# Patient Record
Sex: Female | Born: 1990 | Race: White | Hispanic: No | Marital: Married | Smoking: Never smoker
Health system: Southern US, Community
[De-identification: ages and names within clinical notes are randomized; demographics above are authoritative.]

## PROBLEM LIST (undated history)

## (undated) DIAGNOSIS — I10 Essential (primary) hypertension: Secondary | ICD-10-CM

## (undated) HISTORY — PX: DIAGNOSTIC LAPAROSCOPY: SUR761

## (undated) HISTORY — PX: NO PAST SURGERIES: SHX2092

---

## 2017-09-15 ENCOUNTER — Encounter: Payer: Self-pay | Admitting: Certified Nurse Midwife

## 2017-09-15 ENCOUNTER — Ambulatory Visit: Payer: BLUE CROSS/BLUE SHIELD | Admitting: Certified Nurse Midwife

## 2017-09-15 VITALS — BP 138/104 | HR 78 | Ht 65.0 in | Wt 319.4 lb

## 2017-09-15 DIAGNOSIS — N939 Abnormal uterine and vaginal bleeding, unspecified: Secondary | ICD-10-CM | POA: Diagnosis not present

## 2017-09-15 LAB — POCT URINE PREGNANCY: Preg Test, Ur: NEGATIVE

## 2017-09-15 NOTE — Progress Notes (Signed)
GYN ENCOUNTER NOTE  Subjective:       Natasha Carter is a 27 y.o. G0P0000 female is here for gynecologic evaluation of the following issues:  1. Abnormal uterine bleeding. She states that she has been spotting ( brown) every day since September. She has a history of irregular periods that she had been treated with birth control in the past . She that it did not work to regulate her bleeding. It made her bleeding heavier. She admits to 100 lb weight gain over the last 3 years. She is dieting and exercising now and has lost some weight. She also state she is concerned about a "prolapse"  . Stating that when she moved in July when picking up a table she felt a "drop" the started to vaginal bleeding .    Gynecologic History Patient's last menstrual period was 08/14/2017 (approximate). Contraception: none   Obstetric History OB History  Gravida Para Term Preterm AB Living  0 0 0 0 0 0  SAB TAB Ectopic Multiple Live Births  0 0 0 0 0        History reviewed. No pertinent past medical history.  History reviewed. No pertinent surgical history.  No current outpatient medications on file prior to visit.   No current facility-administered medications on file prior to visit.     No Known Allergies  Social History   Socioeconomic History  . Marital status: Married    Spouse name: Not on file  . Number of children: Not on file  . Years of education: Not on file  . Highest education level: Not on file  Social Needs  . Financial resource strain: Not on file  . Food insecurity - worry: Not on file  . Food insecurity - inability: Not on file  . Transportation needs - medical: Not on file  . Transportation needs - non-medical: Not on file  Occupational History  . Not on file  Tobacco Use  . Smoking status: Never Smoker  . Smokeless tobacco: Never Used  Substance and Sexual Activity  . Alcohol use: No    Frequency: Never  . Drug use: No  . Sexual activity: Not Currently    Birth  control/protection: None  Other Topics Concern  . Not on file  Social History Narrative  . Not on file    History reviewed. No pertinent family history.  The following portions of the patient's history were reviewed and updated as appropriate: allergies, current medications, past family history, past medical history, past social history, past surgical history and problem list.  Review of Systems Review of Systems - Negative except as mentioned in HPI  Review of Systems - General ROS: negative for - chills, fatigue, fever, hot flashes, malaise or night sweats Hematological and Lymphatic ROS: negative for - bleeding problems or swollen lymph nodes Gastrointestinal ROS: negative for - abdominal pain, blood in stools, change in bowel habits and nausea/vomiting Musculoskeletal ROS: negative for - joint pain, muscle pain or muscular weakness Genito-Urinary ROS: negative for - change in menstrual cycle, dysmenorrhea, dyspareunia, dysuria, genital discharge, genital ulcers, hematuria, incontinence, irregular/heavy menses, nocturia or pelvic pain. Positive for irregular bleeding and spotting and pressure  Objective:   BP (!) 138/104   Pulse 78   Ht 5\' 5"  (1.651 m)   Wt (!) 319 lb 6 oz (144.9 kg)   LMP 08/14/2017 (Approximate)   BMI 53.15 kg/m  CONSTITUTIONAL: Well-developed, well-nourished morbidly obese female in no acute distress.  HENT:  Normocephalic, atraumatic.  NECK:  Normal range of motion, supple,  SKIN: Skin is warm and dry. No rash noted. Not diaphoretic. No erythema. No pallor. NEUROLGIC: Alert and oriented to person, place, and time.  PSYCHIATRIC: Normal mood and affect. Normal behavior. Normal judgment and thought content. CARDIOVASCULAR:Not Examined RESPIRATORY: Not Examined BREASTS: Not Examined ABDOMEN: Soft, non distended; Non tender.  No Organomegaly. Exam compromised due to body habitus PELVIC:  External Genitalia: Normal  BUS: Normal  Vagina: Normal, no signs of  prolapse, small amount brown blood seen.   Cervix: Normal  Uterus: Normal size, shape,consistency, mobile, exam compromised due to body habitus  Adnexa: Normal  RV: Normal   Bladder: Nontender MUSCULOSKELETAL: Normal range of motion. No tenderness.  No cyanosis, clubbing, or edema.     Assessment:   1. Abnormal uterine bleeding - CBC - TSH - Prolactin - FSH/LH - Testosterone - US PELVIS (TRANSABDOMINAL ONLY); Future - POCT urine pregnancy     Plan:   Encouraged continued weigh loss with diet and exercise. Discussed weight in relationship to menstrual cycle. Labs today. Discussed elevated BP today. She was encouraged to follow up with primary care. She declines referral stating she will see the provider her partner sees. Ultrasound ordered. Will follow up with results.   Doreene Burke, CN M

## 2017-09-15 NOTE — Progress Notes (Signed)
Pt is here with c/o irregular periods. She is concerned she might have a prolapse.

## 2017-09-15 NOTE — Patient Instructions (Signed)

## 2017-09-16 LAB — CBC
HEMATOCRIT: 40.5 % (ref 34.0–46.6)
HEMOGLOBIN: 14 g/dL (ref 11.1–15.9)
MCH: 28.6 pg (ref 26.6–33.0)
MCHC: 34.6 g/dL (ref 31.5–35.7)
MCV: 83 fL (ref 79–97)
Platelets: 244 10*3/uL (ref 150–379)
RBC: 4.9 x10E6/uL (ref 3.77–5.28)
RDW: 13.2 % (ref 12.3–15.4)
WBC: 6.4 10*3/uL (ref 3.4–10.8)

## 2017-09-16 LAB — TESTOSTERONE: TESTOSTERONE: 43 ng/dL (ref 8–48)

## 2017-09-16 LAB — FSH/LH
FSH: 4.9 m[IU]/mL
LH: 13.7 m[IU]/mL

## 2017-09-16 LAB — PROLACTIN: PROLACTIN: 14 ng/mL (ref 4.8–23.3)

## 2017-09-16 LAB — TSH: TSH: 1.27 u[IU]/mL (ref 0.450–4.500)

## 2017-09-21 ENCOUNTER — Telehealth: Payer: Self-pay

## 2017-09-21 NOTE — Telephone Encounter (Signed)
Pt informed of normal test results per provider

## 2017-11-10 ENCOUNTER — Telehealth: Payer: Self-pay | Admitting: *Deleted

## 2017-11-10 NOTE — Telephone Encounter (Signed)
Patient called and states that she is experiencing heavy bleeding .Patient states she has been bleeding since her last appt on and off pretty light. Patient states today the bleeding was really heavy and she passed 3 blood clots. Patient states she has been very tired and dizzy since then. The bleeding has stopped. Patient is requesting a call back. Her call back # is [phone]. Please advise. Thank you

## 2017-11-11 ENCOUNTER — Telehealth: Payer: Self-pay

## 2017-11-11 NOTE — Telephone Encounter (Signed)
Message left for pt to return call

## 2017-12-16 NOTE — H&P (Signed)
Ms. Leuthold is a 27 y.o. female here for Discuss sono . Pt with continued heavy menorrhagia  Not controlled with provera and OCPs . soaking bed sheets    Past Medical History:  has a past medical history of Hypertension.  Past Surgical History:  has no past surgical history on file. Family History: family history includes Lung cancer in her paternal grandmother. Social History:  reports that she has never smoked. She has never used smokeless tobacco. She reports that she drank alcohol. OB/GYN History:          OB History    Gravida  0   Para  0   Term  0   Preterm  0   AB  0   Living  0     SAB  0   TAB  0   Ectopic  0   Molar  0   Multiple  0   Live Births  0          Allergies: has No Known Allergies. Medications:  Current Outpatient Medications:  .  hydroCHLOROthiazide (HYDRODIURIL) 25 MG tablet, , Disp: , Rfl: 0 .  norethindrone-ethinyl estradiol (ORTHO-NOVUM, NORTREL) 1-35 mg-mcg tablet, Take 1 tablet by mouth once daily, Disp: 3 Package, Rfl: 4 .  ondansetron (ZOFRAN) 8 MG tablet, Take 1 tablet (8 mg total) by mouth every 4 (four) hours, Disp: 20 tablet, Rfl: 0 .  phentermine (ADIPEX-P) 37.5 MG capsule, Take 1 capsule (37.5 mg total) by mouth every morning before breakfast, Disp: 30 capsule, Rfl: 0  Review of Systems: General:                      No fatigue or weight loss Eyes:                           No vision changes Ears:                            No hearing difficulty Respiratory:                No cough or shortness of breath Pulmonary:                  No asthma or shortness of breath Cardiovascular:           No chest pain, palpitations, dyspnea on exertion Gastrointestinal:          No abdominal bloating, chronic diarrhea, constipations, masses, pain or hematochezia Genitourinary:             No hematuria, dysuria, abnormal vaginal discharge, pelvic pain, Menometrorrhagia Lymphatic:                   No swollen lymph  nodes Musculoskeletal:         No muscle weakness Neurologic:                  No extremity weakness, syncope, seizure disorder Psychiatric:                  No history of depression, delusions or suicidal/homicidal ideation    Exam:      Vitals:   12/16/17 1050  BP: (!) 143/108  Pulse: 89    Body mass index is 52.25 kg/m.  WDWN white/  female in NAD   Lungs: CTA  CV : RRR without murmur   Neck:  no  thyromegaly Abdomen: soft , no mass, normal active bowel sounds,  non-tender, no rebound tenderness Pelvic: tanner stage 5 ,  External genitalia: vulva /labia no lesions Urethra: no prolapse Vagina: normal physiologic d/c 2 cc with clots Cervix: no lesions, no cervical motion tenderness   Uterus: normal size shape and contour, non-tender Adnexa: no mass,  non-tender   Rectovaginal: U/s:  Transvag (limited views due to body habitus) & transabd scans performed   Ut retroverted  Endometrium=14.63mm  No free fluid seen  Rt ovary simple cyst=1.4cm  Lt ovary simple cyst=1.2cm        Impression:   The primary encounter diagnosis was Abnormal uterine bleeding (AUB), unspecified. A diagnosis of Menorrhagia with irregular cycle was also pertinent to this visit.  Failed conservative tx. Needs surgical intervention   Plan:  Dilation and curettage tomorrow  Benefits and risks to surgery: The proposed benefit of the surgery has been discussed with the patient. The possible risks include, but are not limited to: organ injury to the bowel , bladder, ureters, and major blood vessels and nerves. There is a possibility of additional surgeries resulting from these injuries. There is also the risk of blood transfusion and the need to receive blood products during or after the procedure which may rarely lead to HIV or Hepatitis C infection. There is a risk of developing a deep venous thrombosis or a pulmonary embolism . There is the possibility of wound infection  and also anesthetic complications, even the rare possibility of death. The patient understands these risks and wishes to proceed. All questions have been answered and the consent has been signed.    No follow-ups on file.  Vilma Prader, MD

## 2017-12-17 ENCOUNTER — Other Ambulatory Visit: Payer: Self-pay

## 2017-12-17 ENCOUNTER — Ambulatory Visit
Admission: RE | Admit: 2017-12-17 | Discharge: 2017-12-17 | Disposition: A | Discharge status: Home / Self Care | Payer: BLUE CROSS/BLUE SHIELD | Source: Ambulatory Visit | Attending: Obstetrics and Gynecology | Admitting: Obstetrics and Gynecology

## 2017-12-17 ENCOUNTER — Ambulatory Visit: Payer: BLUE CROSS/BLUE SHIELD

## 2017-12-17 ENCOUNTER — Encounter
Admission: RE | Disposition: A | Discharge status: Home / Self Care | Payer: Self-pay | Source: Ambulatory Visit | Attending: Obstetrics and Gynecology

## 2017-12-17 ENCOUNTER — Encounter: Payer: Self-pay | Admitting: *Deleted

## 2017-12-17 DIAGNOSIS — N920 Excessive and frequent menstruation with regular cycle: Secondary | ICD-10-CM | POA: Insufficient documentation

## 2017-12-17 DIAGNOSIS — I10 Essential (primary) hypertension: Secondary | ICD-10-CM | POA: Diagnosis not present

## 2017-12-17 DIAGNOSIS — N915 Oligomenorrhea, unspecified: Secondary | ICD-10-CM | POA: Insufficient documentation

## 2017-12-17 DIAGNOSIS — Z79899 Other long term (current) drug therapy: Secondary | ICD-10-CM | POA: Insufficient documentation

## 2017-12-17 DIAGNOSIS — Z793 Long term (current) use of hormonal contraceptives: Secondary | ICD-10-CM | POA: Insufficient documentation

## 2017-12-17 HISTORY — DX: Essential (primary) hypertension: I10

## 2017-12-17 HISTORY — PX: DILATION AND CURETTAGE OF UTERUS: SHX78

## 2017-12-17 LAB — BASIC METABOLIC PANEL
Anion gap: 6 (ref 5–15)
BUN: 13 mg/dL (ref 6–20)
CO2: 24 mmol/L (ref 22–32)
Calcium: 8.9 mg/dL (ref 8.9–10.3)
Chloride: 105 mmol/L (ref 101–111)
Creatinine, Ser: 0.92 mg/dL (ref 0.44–1.00)
GFR calc Af Amer: >60 mL/min (ref >60)
GLUCOSE: 108 mg/dL — AB (ref 65–99)
POTASSIUM: 4 mmol/L (ref 3.5–5.1)
Sodium: 135 mmol/L (ref 135–145)

## 2017-12-17 LAB — CBC
HCT: 40 % (ref 35.0–47.0)
Hemoglobin: 13.9 g/dL (ref 12.0–16.0)
MCH: 29.4 pg (ref 26.0–34.0)
MCHC: 34.8 g/dL (ref 32.0–36.0)
MCV: 84.6 fL (ref 80.0–100.0)
PLATELETS: 249 10*3/uL (ref 150–440)
RBC: 4.73 MIL/uL (ref 3.80–5.20)
RDW: 13.4 % (ref 11.5–14.5)
WBC: 6.6 10*3/uL (ref 3.6–11.0)

## 2017-12-17 LAB — POCT PREGNANCY, URINE: Preg Test, Ur: NEGATIVE

## 2017-12-17 SURGERY — DILATION AND CURETTAGE
Anesthesia: General | Site: Uterus | Wound class: Clean Contaminated

## 2017-12-17 MED ORDER — ONDANSETRON HCL 4 MG/2ML IJ SOLN
INTRAMUSCULAR | Status: AC
  Filled 2017-12-17: qty 2

## 2017-12-17 MED ORDER — ACETAMINOPHEN 10 MG/ML IV SOLN
INTRAVENOUS | Status: AC
  Filled 2017-12-17: qty 100

## 2017-12-17 MED ORDER — FENTANYL CITRATE (PF) 100 MCG/2ML IJ SOLN
INTRAMUSCULAR | Status: AC
  Administered 2017-12-17: 25 ug via INTRAVENOUS
  Filled 2017-12-17: qty 2

## 2017-12-17 MED ORDER — ACETAMINOPHEN 10 MG/ML IV SOLN
INTRAVENOUS | Status: DC | PRN
  Administered 2017-12-17: 1000 mg via INTRAVENOUS

## 2017-12-17 MED ORDER — EPHEDRINE SULFATE 50 MG/ML IJ SOLN
INTRAMUSCULAR | Status: DC | PRN
  Administered 2017-12-17 (×2): 10 mg via INTRAVENOUS

## 2017-12-17 MED ORDER — FAMOTIDINE 20 MG PO TABS
20.0000 mg | ORAL_TABLET | Freq: Once | ORAL | Status: AC
  Administered 2017-12-17: 20 mg via ORAL

## 2017-12-17 MED ORDER — OXYCODONE HCL 5 MG PO TABS
ORAL_TABLET | ORAL | Status: AC
  Filled 2017-12-17: qty 1

## 2017-12-17 MED ORDER — LACTATED RINGERS IV SOLN
INTRAVENOUS | Status: DC
  Administered 2017-12-17: 08:00:00 via INTRAVENOUS

## 2017-12-17 MED ORDER — KETOROLAC TROMETHAMINE 30 MG/ML IJ SOLN
INTRAMUSCULAR | Status: AC
  Filled 2017-12-17: qty 1

## 2017-12-17 MED ORDER — PROPOFOL 10 MG/ML IV BOLUS
INTRAVENOUS | Status: AC
  Filled 2017-12-17: qty 20

## 2017-12-17 MED ORDER — LIDOCAINE HCL (PF) 2 % IJ SOLN
INTRAMUSCULAR | Status: AC
  Filled 2017-12-17: qty 10

## 2017-12-17 MED ORDER — LIDOCAINE HCL (CARDIAC) PF 100 MG/5ML IV SOSY
PREFILLED_SYRINGE | INTRAVENOUS | Status: DC | PRN
  Administered 2017-12-17: 80 mg via INTRAVENOUS

## 2017-12-17 MED ORDER — DEXAMETHASONE SODIUM PHOSPHATE 10 MG/ML IJ SOLN
INTRAMUSCULAR | Status: DC | PRN
  Administered 2017-12-17: 5 mg via INTRAVENOUS

## 2017-12-17 MED ORDER — MIDAZOLAM HCL 2 MG/2ML IJ SOLN
INTRAMUSCULAR | Status: DC | PRN
  Administered 2017-12-17: 1 mg via INTRAVENOUS

## 2017-12-17 MED ORDER — FENTANYL CITRATE (PF) 100 MCG/2ML IJ SOLN
25.0000 ug | INTRAMUSCULAR | Status: DC | PRN
  Administered 2017-12-17 (×4): 25 ug via INTRAVENOUS

## 2017-12-17 MED ORDER — DEXAMETHASONE SODIUM PHOSPHATE 10 MG/ML IJ SOLN
INTRAMUSCULAR | Status: AC
  Filled 2017-12-17: qty 1

## 2017-12-17 MED ORDER — METHYLERGONOVINE MALEATE 0.2 MG/ML IJ SOLN
INTRAMUSCULAR | Status: AC
  Filled 2017-12-17: qty 1

## 2017-12-17 MED ORDER — PROMETHAZINE HCL 25 MG/ML IJ SOLN: 6.2500 mg | INTRAMUSCULAR | Status: DC | PRN

## 2017-12-17 MED ORDER — PROPOFOL 10 MG/ML IV BOLUS
INTRAVENOUS | Status: DC | PRN
  Administered 2017-12-17: 250 mg via INTRAVENOUS
  Administered 2017-12-17: 40 mg via INTRAVENOUS

## 2017-12-17 MED ORDER — FAMOTIDINE 20 MG PO TABS
ORAL_TABLET | ORAL | Status: AC
  Administered 2017-12-17: 20 mg via ORAL
  Filled 2017-12-17: qty 1

## 2017-12-17 MED ORDER — MEPERIDINE HCL 50 MG/ML IJ SOLN: 6.2500 mg | INTRAMUSCULAR | Status: DC | PRN

## 2017-12-17 MED ORDER — OXYCODONE HCL 5 MG/5ML PO SOLN: 5.0000 mg | Freq: Once | ORAL | Status: AC | PRN

## 2017-12-17 MED ORDER — CEFAZOLIN SODIUM-DEXTROSE 2-4 GM/100ML-% IV SOLN
INTRAVENOUS | Status: AC
  Filled 2017-12-17: qty 100

## 2017-12-17 MED ORDER — SILVER NITRATE-POT NITRATE 75-25 % EX MISC
CUTANEOUS | Status: AC
  Filled 2017-12-17: qty 4

## 2017-12-17 MED ORDER — KETOROLAC TROMETHAMINE 30 MG/ML IJ SOLN
INTRAMUSCULAR | Status: DC | PRN
  Administered 2017-12-17: 30 mg via INTRAVENOUS

## 2017-12-17 MED ORDER — CEFAZOLIN SODIUM-DEXTROSE 2-4 GM/100ML-% IV SOLN
2.0000 g | Freq: Once | INTRAVENOUS | Status: AC
Start: 2017-12-17 — End: 2017-12-17
  Administered 2017-12-17: 2 g via INTRAVENOUS

## 2017-12-17 MED ORDER — FENTANYL CITRATE (PF) 100 MCG/2ML IJ SOLN
INTRAMUSCULAR | Status: DC | PRN
  Administered 2017-12-17: 50 ug via INTRAVENOUS
  Administered 2017-12-17 (×2): 25 ug via INTRAVENOUS

## 2017-12-17 MED ORDER — ONDANSETRON HCL 4 MG/2ML IJ SOLN
INTRAMUSCULAR | Status: DC | PRN
  Administered 2017-12-17: 4 mg via INTRAVENOUS

## 2017-12-17 MED ORDER — FENTANYL CITRATE (PF) 100 MCG/2ML IJ SOLN
INTRAMUSCULAR | Status: AC
  Filled 2017-12-17: qty 2

## 2017-12-17 MED ORDER — MIDAZOLAM HCL 2 MG/2ML IJ SOLN
INTRAMUSCULAR | Status: AC
  Filled 2017-12-17: qty 2

## 2017-12-17 MED ORDER — OXYCODONE HCL 5 MG PO TABS
5.0000 mg | ORAL_TABLET | Freq: Once | ORAL | Status: AC | PRN
  Administered 2017-12-17: 5 mg via ORAL

## 2017-12-17 SURGICAL SUPPLY — 20 items
CATH ROBINSON RED A/P 16FR (CATHETERS) ×2 IMPLANT
FILTER UTR ASPR SPEC (MISCELLANEOUS) IMPLANT
FLTR UTR ASPR SPEC (MISCELLANEOUS)
GLOVE BIO SURGEON STRL SZ8 (GLOVE) ×2 IMPLANT
GOWN STRL REUS W/ TWL LRG LVL3 (GOWN DISPOSABLE) ×1 IMPLANT
GOWN STRL REUS W/ TWL XL LVL3 (GOWN DISPOSABLE) ×1 IMPLANT
GOWN STRL REUS W/TWL LRG LVL3 (GOWN DISPOSABLE) ×1
GOWN STRL REUS W/TWL XL LVL3 (GOWN DISPOSABLE) ×1
KIT TURNOVER CYSTO (KITS) ×2 IMPLANT
PACK DNC HYST (MISCELLANEOUS) ×2 IMPLANT
PAD OB MATERNITY 4.3X12.25 (PERSONAL CARE ITEMS) ×2 IMPLANT
PAD PREP 24X41 OB/GYN DISP (PERSONAL CARE ITEMS) ×2 IMPLANT
SET BERKELEY SUCTION TUBING (SUCTIONS) IMPLANT
TOWEL OR 17X26 4PK STRL BLUE (TOWEL DISPOSABLE) ×2 IMPLANT
VACURETTE 10 RIGID CVD (CANNULA) IMPLANT
VACURETTE 6 ASPIR F TIP BERK (CANNULA) IMPLANT
VACURETTE 7MM F TIP (CANNULA)
VACURETTE 7MM F TIP STRL (CANNULA) IMPLANT
VACURETTE 8 RIGID CVD (CANNULA) IMPLANT
VACURETTE 8MM F TIP (MISCELLANEOUS) IMPLANT

## 2017-12-17 NOTE — Op Note (Signed)
NAMELuccia, Natasha Carter [facility] MEDICAL RECORD NW:[id number] ACCOUNT [id number] DATE OF BIRTH:[birth year] FACILITY: [facility] LOCATION: [facility] PHYSICIAN:Ameri Cahoon Cloyde Reams, MD  OPERATIVE REPORT  DATE OF PROCEDURE:  12/17/2017  PREOPERATIVE DIAGNOSIS:  Menorrhagia, uncontrolled by conservative treatment.  POSTOPERATIVE DIAGNOSIS:  Menorrhagia, uncontrolled by conservative treatment.  PROCEDURE:  Dilation and curettage.  ANESTHESIA:  LMA.    SURGEON:  Suzy Bouchard, MD  INDICATIONS:  This is a 27 year old gravida 0 patient with a long history of abnormal uterine bleeding with oligomenorrhea.  This has been going on for over a year, but more recently in the last several weeks bleeding has gotten extremely heavy.   Conservative treatment with Provera and high dose birth control pills was unsuccessful in controlling the amount of her heavy bleeding.  DESCRIPTION OF PROCEDURE:  After adequate LMA anesthesia, the patient was placed in dorsal supine position with legs in the Seltzer stirrups.  Patient's lower abdomen, perineum and vagina were prepped and draped in normal sterile fashion.  The patient did  receive 2 grams of IV Ancef for surgical prophylaxis.  Timeout was performed.  Straight catheterization of the bladder yielded 75 mL urine.  A weighted speculum was placed in the posterior vaginal vault and the anterior cervix was grasped with a single  tooth tenaculum.  Uterus sounded to 8 cm and then was dilated to a #20 Hanks dilator without difficulty.  Sharp curettage was performed with copious endometrial tissue removed.  This will be sent to pathology for identification.  A good uterine cry  throughout the uterus on repeated curettage.  Good hemostasis was noted.  No complications.    ESTIMATED BLOOD LOSS:  5 mL.  URINE OUTPUT:  75 mL.  INTRAOPERATIVE FLUIDS:  400 mL.    The patient tolerated the procedure well and was taken to recovery room in good condition.  AN/NUANCE   D:12/17/2017 T:12/17/2017 JOB:[id number]

## 2017-12-17 NOTE — Anesthesia Procedure Notes (Signed)
Procedure Name: LMA Insertion Date/Time: 12/17/2017 8:24 AM Performed by: Rosario Jacks, CRNA Pre-anesthesia Checklist: Patient identified, Patient being monitored, Timeout performed, Emergency Drugs available and Suction available Patient Re-evaluated:Patient Re-evaluated prior to induction Oxygen Delivery Method: Circle system utilized Preoxygenation: Pre-oxygenation with 100% oxygen Induction Type: IV induction Ventilation: Mask ventilation without difficulty LMA: LMA inserted LMA Size: 4.0 Tube type: Oral Number of attempts: 1 Placement Confirmation: positive ETCO2 and breath sounds checked- equal and bilateral Tube secured with: Tape Dental Injury: Teeth and Oropharynx as per pre-operative assessment

## 2017-12-17 NOTE — Progress Notes (Signed)
Ready for D+C this am for menorrhagia.  All questions answered . Proceed

## 2017-12-17 NOTE — Transfer of Care (Signed)
Immediate Anesthesia Transfer of Care Note  Patient: Natasha Carter  Procedure(s) Performed: DILATATION AND CURETTAGE (N/A Uterus)  Patient Location: PACU  Anesthesia Type:General  Level of Consciousness: awake  Airway & Oxygen Therapy: Patient Spontanous Breathing  Post-op Assessment: Report given to RN  Post vital signs: stable  Last Vitals:  Vitals Value Taken Time  BP 90/46 12/17/2017  9:02 AM  Temp    Pulse 79 12/17/2017  9:03 AM  Resp 13 12/17/2017  9:03 AM  SpO2 100 % 12/17/2017  9:03 AM  Vitals shown include unvalidated device data.  Last Pain:  Vitals:   12/17/17 0748  TempSrc: Tympanic  PainSc: 0-No pain         Complications: No apparent anesthesia complications

## 2017-12-17 NOTE — Brief Op Note (Signed)
12/17/2017  8:53 AM  PATIENT:  Natasha Carter  27 y.o. female  PRE-OPERATIVE DIAGNOSIS:  abnormal uterine bleeding menorrhagia POST-OPERATIVE DIAGNOSIS:  abnormal uterine bleeding menorrhagia PROCEDURE:  Procedure(s): DILATATION AND CURETTAGE (N/A)  SURGEON:  Surgeon(s) and Role:    * Terriana Barreras, Ihor Austin, MD - Primary  PHYSICIAN ASSISTANT:   ASSISTANTS: none   ANESTHESIA:  geta  BLOOD ADMINISTERED:none  DRAINS: none   LOCAL MEDICATIONS USED:  NONE  SPECIMEN:  Source of Specimen:  endometrial curettings   DISPOSITION OF SPECIMEN:  PATHOLOGY  COUNTS:  YES  TOURNIQUET:  * No tourniquets in log *  DICTATION: .Other Dictation: Dictation Number verbal  PLAN OF CARE: Discharge to home after PACU  PATIENT DISPOSITION:  PACU - hemodynamically stable.   Delay start of Pharmacological VTE agent (>24hrs) due to surgical blood loss or risk of bleeding: not applicable

## 2017-12-17 NOTE — Anesthesia Post-op Follow-up Note (Signed)
Anesthesia QCDR form completed.        

## 2017-12-17 NOTE — Anesthesia Preprocedure Evaluation (Signed)
Anesthesia Evaluation  Patient identified by MRN, date of birth, ID band Patient awake    Reviewed: Allergy & Precautions, NPO status , Patient's Chart, lab work & pertinent test results  History of Anesthesia Complications Negative for: history of anesthetic complications  Airway Mallampati: III  TM Distance: >3 FB Neck ROM: Full    Dental no notable dental hx.    Pulmonary neg pulmonary ROS, neg sleep apnea, neg COPD,    breath sounds clear to auscultation- rhonchi (-) wheezing      Cardiovascular Exercise Tolerance: Good hypertension, (-) CAD, (-) Past MI, (-) Cardiac Stents and (-) CABG  Rhythm:Regular Rate:Normal - Systolic murmurs and - Diastolic murmurs    Neuro/Psych negative psych ROS   GI/Hepatic negative GI ROS, Neg liver ROS,   Endo/Other  negative endocrine ROSneg diabetes  Renal/GU negative Renal ROS     Musculoskeletal negative musculoskeletal ROS (+)   Abdominal (+) + obese,   Peds  Hematology negative hematology ROS (+)   Anesthesia Other Findings Past Medical History: No date: Hypertension   Reproductive/Obstetrics                             Anesthesia Physical Anesthesia Plan  ASA: II  Anesthesia Plan: General   Post-op Pain Management:    Induction: Intravenous  PONV Risk Score and Plan: 2 and Ondansetron, Midazolam and Dexamethasone  Airway Management Planned: LMA  Additional Equipment:   Intra-op Plan:   Post-operative Plan:   Informed Consent: I have reviewed the patients History and Physical, chart, labs and discussed the procedure including the risks, benefits and alternatives for the proposed anesthesia with the patient or authorized representative who has indicated his/her understanding and acceptance.   Dental advisory given  Plan Discussed with: CRNA and Anesthesiologist  Anesthesia Plan Comments:         Anesthesia Quick  Evaluation

## 2017-12-17 NOTE — Anesthesia Postprocedure Evaluation (Signed)
Anesthesia Post Note  Patient: Psychologist, occupational  Procedure(s) Performed: DILATATION AND CURETTAGE (N/A Uterus)  Patient location during evaluation: PACU Anesthesia Type: General Level of consciousness: awake and alert and oriented Pain management: pain level controlled Vital Signs Assessment: post-procedure vital signs reviewed and stable Respiratory status: spontaneous breathing, nonlabored ventilation and respiratory function stable Cardiovascular status: blood pressure returned to baseline and stable Postop Assessment: no signs of nausea or vomiting Anesthetic complications: no     Last Vitals:  Vitals:   12/17/17 0917 12/17/17 0932  BP: (!) 99/51 (!) 94/51  Pulse: 71 73  Resp: 19 (!) 22  Temp:    SpO2: 100% 97%    Last Pain:  Vitals:   12/17/17 0941  TempSrc:   PainSc: 6                  Myrka Sylva

## 2017-12-18 ENCOUNTER — Encounter: Payer: Self-pay | Admitting: Obstetrics and Gynecology

## 2017-12-20 LAB — SURGICAL PATHOLOGY

## 2017-12-23 LAB — TYPE AND SCREEN
ABO/RH(D): O NEG
Antibody Screen: NEGATIVE

## 2019-09-11 ENCOUNTER — Ambulatory Visit: Payer: BC Managed Care – PPO | Attending: Internal Medicine

## 2019-09-11 DIAGNOSIS — Z20822 Contact with and (suspected) exposure to covid-19: Secondary | ICD-10-CM

## 2019-09-12 LAB — NOVEL CORONAVIRUS, NAA: SARS-CoV-2, NAA: NOT DETECTED

## 2020-04-21 DIAGNOSIS — U071 COVID-19: Secondary | ICD-10-CM | POA: Diagnosis not present

## 2020-04-21 DIAGNOSIS — Z20822 Contact with and (suspected) exposure to covid-19: Secondary | ICD-10-CM | POA: Diagnosis not present

## 2020-05-08 DIAGNOSIS — Z1389 Encounter for screening for other disorder: Secondary | ICD-10-CM | POA: Diagnosis not present

## 2020-05-08 DIAGNOSIS — R1900 Intra-abdominal and pelvic swelling, mass and lump, unspecified site: Secondary | ICD-10-CM | POA: Diagnosis not present

## 2020-05-08 DIAGNOSIS — R198 Other specified symptoms and signs involving the digestive system and abdomen: Secondary | ICD-10-CM | POA: Diagnosis not present

## 2020-09-23 DIAGNOSIS — N912 Amenorrhea, unspecified: Secondary | ICD-10-CM | POA: Diagnosis not present

## 2020-09-26 DIAGNOSIS — N912 Amenorrhea, unspecified: Secondary | ICD-10-CM | POA: Diagnosis not present

## 2020-10-03 ENCOUNTER — Inpatient Hospital Stay: Payer: BC Managed Care – PPO | Admitting: Anesthesiology

## 2020-10-03 ENCOUNTER — Other Ambulatory Visit: Payer: Self-pay

## 2020-10-03 ENCOUNTER — Inpatient Hospital Stay
Admission: AD | Admit: 2020-10-03 | Discharge: 2020-10-03 | DRG: 819 | Disposition: A | Discharge status: Home / Self Care | Payer: BC Managed Care – PPO

## 2020-10-03 ENCOUNTER — Encounter: Admission: AD | Disposition: A | Discharge status: Home / Self Care | Payer: Self-pay | Source: Home / Self Care

## 2020-10-03 ENCOUNTER — Inpatient Hospital Stay: Admit: 2020-10-03 | Payer: BC Managed Care – PPO | Admitting: Obstetrics & Gynecology

## 2020-10-03 ENCOUNTER — Encounter: Payer: Self-pay | Admitting: Obstetrics & Gynecology

## 2020-10-03 ENCOUNTER — Inpatient Hospital Stay
Admission: RE | Admit: 2020-10-03 | Payer: BC Managed Care – PPO | Source: Ambulatory Visit | Admitting: Obstetrics & Gynecology

## 2020-10-03 DIAGNOSIS — Z6791 Unspecified blood type, Rh negative: Secondary | ICD-10-CM | POA: Diagnosis not present

## 2020-10-03 DIAGNOSIS — O00101 Right tubal pregnancy without intrauterine pregnancy: Principal | ICD-10-CM | POA: Diagnosis present

## 2020-10-03 DIAGNOSIS — Z2913 Encounter for prophylactic Rho(D) immune globulin: Secondary | ICD-10-CM | POA: Diagnosis not present

## 2020-10-03 DIAGNOSIS — O3680X0 Pregnancy with inconclusive fetal viability, not applicable or unspecified: Secondary | ICD-10-CM | POA: Diagnosis not present

## 2020-10-03 DIAGNOSIS — N8311 Corpus luteum cyst of right ovary: Secondary | ICD-10-CM | POA: Diagnosis present

## 2020-10-03 DIAGNOSIS — O009 Unspecified ectopic pregnancy without intrauterine pregnancy: Secondary | ICD-10-CM | POA: Insufficient documentation

## 2020-10-03 DIAGNOSIS — O00109 Unspecified tubal pregnancy without intrauterine pregnancy: Secondary | ICD-10-CM | POA: Diagnosis not present

## 2020-10-03 DIAGNOSIS — Z20822 Contact with and (suspected) exposure to covid-19: Secondary | ICD-10-CM | POA: Diagnosis not present

## 2020-10-03 DIAGNOSIS — R102 Pelvic and perineal pain: Secondary | ICD-10-CM | POA: Diagnosis not present

## 2020-10-03 DIAGNOSIS — I10 Essential (primary) hypertension: Secondary | ICD-10-CM | POA: Diagnosis not present

## 2020-10-03 HISTORY — PX: DIAGNOSTIC LAPAROSCOPY WITH REMOVAL OF ECTOPIC PREGNANCY: SHX6449

## 2020-10-03 LAB — TYPE AND SCREEN
ABO/RH(D): O NEG
Antibody Screen: NEGATIVE

## 2020-10-03 LAB — SARS CORONAVIRUS 2 BY RT PCR (HOSPITAL ORDER, PERFORMED IN ~~LOC~~ HOSPITAL LAB): SARS Coronavirus 2: NEGATIVE

## 2020-10-03 SURGERY — LAPAROSCOPY, WITH ECTOPIC PREGNANCY SURGICAL TREATMENT
Anesthesia: General | Site: Abdomen | Laterality: Right

## 2020-10-03 MED ORDER — OXYCODONE HCL 5 MG PO TABS: 5.0000 mg | ORAL_TABLET | ORAL | 0 refills | Status: DC | PRN

## 2020-10-03 MED ORDER — ONDANSETRON HCL 4 MG/2ML IJ SOLN
INTRAMUSCULAR | Status: DC | PRN
  Administered 2020-10-03: 4 mg via INTRAVENOUS

## 2020-10-03 MED ORDER — LIDOCAINE HCL (CARDIAC) PF 100 MG/5ML IV SOSY
PREFILLED_SYRINGE | INTRAVENOUS | Status: DC | PRN
  Administered 2020-10-03: 100 mg via INTRAVENOUS

## 2020-10-03 MED ORDER — OXYCODONE HCL 5 MG PO TABS: 5.0000 mg | ORAL_TABLET | ORAL | Status: DC | PRN

## 2020-10-03 MED ORDER — MEPERIDINE HCL 50 MG/ML IJ SOLN
6.2500 mg | Freq: Once | INTRAMUSCULAR | Status: AC
Start: 2020-10-03 — End: 2020-10-03

## 2020-10-03 MED ORDER — CHLORHEXIDINE GLUCONATE 0.12 % MT SOLN
15.0000 mL | Freq: Once | OROMUCOSAL | Status: AC
  Filled 2020-10-03: qty 15

## 2020-10-03 MED ORDER — LIDOCAINE HCL (PF) 2 % IJ SOLN
INTRAMUSCULAR | Status: AC
  Filled 2020-10-03: qty 5

## 2020-10-03 MED ORDER — ONDANSETRON HCL 4 MG/2ML IJ SOLN
INTRAMUSCULAR | Status: AC
  Filled 2020-10-03: qty 2

## 2020-10-03 MED ORDER — KETOROLAC TROMETHAMINE 30 MG/ML IJ SOLN
INTRAMUSCULAR | Status: DC | PRN
  Administered 2020-10-03: 30 mg via INTRAVENOUS

## 2020-10-03 MED ORDER — DIPHENHYDRAMINE HCL 50 MG/ML IJ SOLN
INTRAMUSCULAR | Status: DC | PRN
  Administered 2020-10-03: 25 mg via INTRAVENOUS

## 2020-10-03 MED ORDER — SUCCINYLCHOLINE CHLORIDE 200 MG/10ML IV SOSY
PREFILLED_SYRINGE | INTRAVENOUS | Status: AC
  Filled 2020-10-03: qty 10

## 2020-10-03 MED ORDER — FENTANYL CITRATE (PF) 100 MCG/2ML IJ SOLN
INTRAMUSCULAR | Status: AC
  Administered 2020-10-03: 25 ug via INTRAVENOUS
  Filled 2020-10-03: qty 2

## 2020-10-03 MED ORDER — ACETAMINOPHEN 650 MG RE SUPP
650.0000 mg | RECTAL | Status: DC | PRN
Start: 2020-10-03 — End: 2020-10-04

## 2020-10-03 MED ORDER — DEXAMETHASONE SODIUM PHOSPHATE 10 MG/ML IJ SOLN
INTRAMUSCULAR | Status: AC
  Filled 2020-10-03: qty 1

## 2020-10-03 MED ORDER — ACETAMINOPHEN 10 MG/ML IV SOLN
INTRAVENOUS | Status: DC | PRN
  Administered 2020-10-03: 1000 mg via INTRAVENOUS

## 2020-10-03 MED ORDER — FENTANYL CITRATE (PF) 100 MCG/2ML IJ SOLN
INTRAMUSCULAR | Status: DC | PRN
  Administered 2020-10-03: 25 ug via INTRAVENOUS
  Administered 2020-10-03 (×2): 50 ug via INTRAVENOUS
  Administered 2020-10-03: 25 ug via INTRAVENOUS

## 2020-10-03 MED ORDER — LACTATED RINGERS IV SOLN: INTRAVENOUS | Status: DC

## 2020-10-03 MED ORDER — ROCURONIUM BROMIDE 100 MG/10ML IV SOLN
INTRAVENOUS | Status: DC | PRN
  Administered 2020-10-03: 10 mg via INTRAVENOUS
  Administered 2020-10-03: 50 mg via INTRAVENOUS

## 2020-10-03 MED ORDER — FENTANYL CITRATE (PF) 100 MCG/2ML IJ SOLN
25.0000 ug | INTRAMUSCULAR | Status: DC | PRN
  Administered 2020-10-03: 50 ug via INTRAVENOUS
  Filled 2020-10-03: qty 2

## 2020-10-03 MED ORDER — DEXMEDETOMIDINE (PRECEDEX) IN NS 20 MCG/5ML (4 MCG/ML) IV SYRINGE
PREFILLED_SYRINGE | INTRAVENOUS | Status: DC | PRN
  Administered 2020-10-03 (×3): 4 ug via INTRAVENOUS
  Administered 2020-10-03 (×2): 8 ug via INTRAVENOUS

## 2020-10-03 MED ORDER — ROCURONIUM BROMIDE 10 MG/ML (PF) SYRINGE
PREFILLED_SYRINGE | INTRAVENOUS | Status: AC
  Filled 2020-10-03: qty 10

## 2020-10-03 MED ORDER — MEPERIDINE HCL 50 MG/ML IJ SOLN
INTRAMUSCULAR | Status: AC
  Administered 2020-10-03: 6.25 mg via INTRAVENOUS
  Filled 2020-10-03: qty 1

## 2020-10-03 MED ORDER — FENTANYL CITRATE (PF) 100 MCG/2ML IJ SOLN
INTRAMUSCULAR | Status: AC
  Filled 2020-10-03: qty 2

## 2020-10-03 MED ORDER — FENTANYL CITRATE (PF) 100 MCG/2ML IJ SOLN
25.0000 ug | INTRAMUSCULAR | Status: DC | PRN
  Administered 2020-10-03 (×2): 25 ug via INTRAVENOUS

## 2020-10-03 MED ORDER — IBUPROFEN 600 MG PO TABS: 600.0000 mg | ORAL_TABLET | Freq: Four times a day (QID) | ORAL | 1 refills | Status: DC

## 2020-10-03 MED ORDER — PROMETHAZINE HCL 25 MG/ML IJ SOLN: 6.2500 mg | INTRAMUSCULAR | Status: DC | PRN

## 2020-10-03 MED ORDER — SUGAMMADEX SODIUM 500 MG/5ML IV SOLN
INTRAVENOUS | Status: DC | PRN
  Administered 2020-10-03: 250 mg via INTRAVENOUS

## 2020-10-03 MED ORDER — ACETAMINOPHEN 10 MG/ML IV SOLN
INTRAVENOUS | Status: AC
  Filled 2020-10-03: qty 100

## 2020-10-03 MED ORDER — ORAL CARE MOUTH RINSE: 15.0000 mL | Freq: Once | OROMUCOSAL | Status: AC

## 2020-10-03 MED ORDER — MIDAZOLAM HCL 2 MG/2ML IJ SOLN
INTRAMUSCULAR | Status: DC | PRN
  Administered 2020-10-03: 2 mg via INTRAVENOUS

## 2020-10-03 MED ORDER — KETOROLAC TROMETHAMINE 30 MG/ML IJ SOLN
INTRAMUSCULAR | Status: AC
  Filled 2020-10-03: qty 1

## 2020-10-03 MED ORDER — CHLORHEXIDINE GLUCONATE 0.12 % MT SOLN
OROMUCOSAL | Status: AC
  Administered 2020-10-03: 15 mL via OROMUCOSAL
  Filled 2020-10-03: qty 15

## 2020-10-03 MED ORDER — DIPHENHYDRAMINE HCL 50 MG/ML IJ SOLN
INTRAMUSCULAR | Status: AC
  Filled 2020-10-03: qty 1

## 2020-10-03 MED ORDER — ACETAMINOPHEN 325 MG PO TABS: 650.0000 mg | ORAL_TABLET | ORAL | Status: DC | PRN

## 2020-10-03 MED ORDER — PROPOFOL 10 MG/ML IV BOLUS
INTRAVENOUS | Status: DC | PRN
  Administered 2020-10-03: 250 mg via INTRAVENOUS

## 2020-10-03 MED ORDER — DEXAMETHASONE SODIUM PHOSPHATE 10 MG/ML IJ SOLN
INTRAMUSCULAR | Status: DC | PRN
  Administered 2020-10-03: 10 mg via INTRAVENOUS

## 2020-10-03 SURGICAL SUPPLY — 55 items
ADH SKN CLS APL DERMABOND .7 (GAUZE/BANDAGES/DRESSINGS)
ANCHOR TIS RET SYS 235ML (MISCELLANEOUS) IMPLANT
APL PRP STRL LF DISP 70% ISPRP (MISCELLANEOUS) ×1
BACTOSHIELD CHG 4% 4OZ (MISCELLANEOUS)
BAG DRN RND TRDRP ANRFLXCHMBR (UROLOGICAL SUPPLIES) ×1
BAG SPEC RTRVL LRG 6X4 10 (ENDOMECHANICALS)
BAG TISS RTRVL C235 10X14 (MISCELLANEOUS)
BAG URINE DRAIN 2000ML AR STRL (UROLOGICAL SUPPLIES) ×2 IMPLANT
BLADE SURG SZ11 CARB STEEL (BLADE) ×2 IMPLANT
CATH FOLEY 2WAY  5CC 16FR (CATHETERS) ×1
CATH FOLEY 2WAY 5CC 16FR (CATHETERS) ×1
CATH URTH 16FR FL 2W BLN LF (CATHETERS) ×1 IMPLANT
CHLORAPREP W/TINT 26 (MISCELLANEOUS) ×2 IMPLANT
COVER WAND RF STERILE (DRAPES) ×2 IMPLANT
DERMABOND ADVANCED (GAUZE/BANDAGES/DRESSINGS)
DERMABOND ADVANCED .7 DNX12 (GAUZE/BANDAGES/DRESSINGS) IMPLANT
DEVICE SUTURE ENDOST 10MM (ENDOMECHANICALS) IMPLANT
DEVICE TROCAR PUNCTURE CLOSURE (ENDOMECHANICALS) IMPLANT
DRAPE LEGGINS SURG 28X43 STRL (DRAPES) ×2 IMPLANT
DRAPE UNDER BUTTOCK W/FLU (DRAPES) ×2 IMPLANT
ELECT CAUTERY BLADE 6.4 (BLADE) ×2 IMPLANT
GLOVE PI ORTHOPRO 6.5 (GLOVE) ×1
GLOVE PI ORTHOPRO STRL 6.5 (GLOVE) ×1 IMPLANT
GLOVE SURG SYN 6.5 ES PF (GLOVE) ×6 IMPLANT
GOWN STRL REUS W/ TWL LRG LVL3 (GOWN DISPOSABLE) ×2 IMPLANT
GOWN STRL REUS W/TWL LRG LVL3 (GOWN DISPOSABLE) ×4
GRASPER SUT TROCAR 14GX15 (MISCELLANEOUS) ×2 IMPLANT
IRRIGATION STRYKERFLOW (MISCELLANEOUS) ×1 IMPLANT
IRRIGATOR STRYKERFLOW (MISCELLANEOUS) ×2
IV LACTATED RINGERS 1000ML (IV SOLUTION) ×2 IMPLANT
KIT PINK PAD W/HEAD ARE REST (MISCELLANEOUS) ×2
KIT PINK PAD W/HEAD ARM REST (MISCELLANEOUS) ×1 IMPLANT
KIT TURNOVER CYSTO (KITS) ×2 IMPLANT
L-HOOK LAP DISP 36CM (ELECTROSURGICAL) ×2
LHOOK LAP DISP 36CM (ELECTROSURGICAL) ×1 IMPLANT
LIGASURE VESSEL 5MM BLUNT TIP (ELECTROSURGICAL) ×2 IMPLANT
MANIFOLD NEPTUNE II (INSTRUMENTS) ×2 IMPLANT
MANIPULATOR UTERINE 4.5 ZUMI (MISCELLANEOUS) IMPLANT
NS IRRIG 500ML POUR BTL (IV SOLUTION) ×2 IMPLANT
PACK LAP CHOLECYSTECTOMY (MISCELLANEOUS) ×2 IMPLANT
PAD OB MATERNITY 4.3X12.25 (PERSONAL CARE ITEMS) ×2 IMPLANT
PAD PREP 24X41 OB/GYN DISP (PERSONAL CARE ITEMS) ×2 IMPLANT
PENCIL ELECTRO HAND CTR (MISCELLANEOUS) ×2 IMPLANT
POUCH ENDO CATCH 10MM SPEC (MISCELLANEOUS) ×2 IMPLANT
POUCH SPECIMEN RETRIEVAL 10MM (ENDOMECHANICALS) IMPLANT
SCISSORS METZENBAUM CVD 33 (INSTRUMENTS) IMPLANT
SCRUB CHG 4% DYNA-HEX 4OZ (MISCELLANEOUS) IMPLANT
SET TUBE SMOKE EVAC HIGH FLOW (TUBING) ×2 IMPLANT
SLEEVE ENDOPATH XCEL 5M (ENDOMECHANICALS) ×4 IMPLANT
SUT ENDO VLOC 180-0-8IN (SUTURE) IMPLANT
SUT MNCRL AB 4-0 PS2 18 (SUTURE) ×2 IMPLANT
SUT VIC AB 2-0 UR6 27 (SUTURE) IMPLANT
SYR 10ML LL (SYRINGE) ×2 IMPLANT
TROCAR ENDO BLADELESS 11MM (ENDOMECHANICALS) ×2 IMPLANT
TROCAR XCEL NON-BLD 5MMX100MML (ENDOMECHANICALS) ×2 IMPLANT

## 2020-10-03 NOTE — Op Note (Signed)
Natasha Carter PROCEDURE DATE: 10/03/2020  PATIENT:  Natasha Carter  30 y.o. female  PRE-OPERATIVE DIAGNOSIS:  Ectopic pregnancy  POST-OPERATIVE DIAGNOSIS:  Ectopic pregnancy - right fallopian tube  PROCEDURE:  Procedure(s): LAPAROSCOPIC RIGHT SALPINGECTOMY WITH REMOVAL OF ECTOPIC PREGNANCY (Right)  SURGEON:  Surgeon(s) and Role:    * Akai Dollard, Elenora Fender, MD - Primary    * Schermerhorn, Ihor Austin, MD - Assisting  ANESTHESIA:  General via ET  I/O  Total I/O In: 700 [I.V.:700] Out: 150 [Blood:150]  FINDINGS:   Small boggy uterus consistent with pregnancy Normal upper abdomen. Normal LEFT fallopian tube and ovary Right ovary with corpus luteum cyst RIGHT fallopian tube with ectopic pregnancy within isthmus, normal fimbriated end  SPECIMEN: right fallopian tube with ectopic pregnancy  COMPLICATIONS: none apparent  DISPOSITION: vital signs stable to PACU   Indication for Surgery: 30 y.o. G1P0000 being followed with pregnancy of unknown location and found to have a right adnexal mass today on ultrasound, and while under observation began to complain of worsening RLQ pain.  Beta HCG today was 11.7k.  With her worsening pain she was not a candidate for outpatient medical management.  Risks of surgery were discussed with the patient including but not limited to: bleeding which may require transfusion or reoperation; infection which may require antibiotics; injury to bowel, bladder, ureters or other surrounding organs; need for additional procedures including laparotomy, blood clot, incisional problems and other postoperative/anesthesia complications. Written informed consent was obtained.     PROCEDURE IN DETAIL:  The patient had sequential compression devices applied to her lower extremities while in the preoperative area.  She was then taken to the operating room where general anesthesia was administered and was found to be adequate.  She was placed in the dorsal lithotomy position, and was  prepped and draped in a sterile manner.  A sponge stick was kept in the vagina for manipulation.  After a surgical timeout was performed, attention was turned to the abdomen where an umbilical incision was made with the scalpel. A 38mm trochar was inserted in the umbilical incision using a visiport method. Opening pressure was , and the abdomen was insufflated to carbon dioxide gas and adequate pneumoperitoneum was obtained.  A survey of the patient's pelvis and abdomen revealed the findings as mentioned above. A left 69mm port and a right 36mm ports were inserted in the lower left and right quadrants under visualization.    The right tube was inspected and found to have the ectopic pregnancy within the central portion of the isthmus.  A salpingostomy was attempted.  The bovie hook was used with cut and divided the serosa of the tube.  The chorionic tissue was milked out of the surrounding structures and the mass was removed.  However, there were several robust bleeding points that were attempted to be cauterized using a maryland and the bovie.  While the bleeding was being corrected, there was concern about the amount of cautery needed to create hemostasis, and whether this was more damaging to the internal structures of the tube.  At this point, the decision was made to remove the remainder of the tube to eliminate concern for structural damage and future ectopic pregnancy in this tube.  The Ligasure was then used to divide the mesosalpinx to the cornua and the tube was removed.    Irrigation showed a hemostatic operative site.  The intraoperative pressure was decreased to and the site remained hemostatic.  No intraoperative injury to surrounding organs  was noted.  The abdomen was desufflated and all instruments were then removed from the patient's abdomen.  All skin incisions were closed with 4-0 monocryl and covered with surgical glue. The vaginal sponge stick was removed.  The patient  tolerated the procedures well.  All instruments, needles, and sponge counts were correct x 2. The patient was taken to the recovery room in stable condition.   ---- Ranae Plumber, MD Attending Obstetrician and Gynecologist [facility]

## 2020-10-03 NOTE — Anesthesia Preprocedure Evaluation (Signed)
Anesthesia Evaluation  Patient identified by MRN, date of birth, ID band Patient awake    Reviewed: Allergy & Precautions, NPO status , Patient's Chart, lab work & pertinent test results  History of Anesthesia Complications Negative for: history of anesthetic complications  Airway Mallampati: III  TM Distance: >3 FB Neck ROM: Full    Dental no notable dental hx. (+) Dental Advidsory Given, Teeth Intact   Pulmonary neg pulmonary ROS, neg shortness of breath, neg sleep apnea, neg COPD, neg recent URI,    breath sounds clear to auscultation- rhonchi (-) wheezing      Cardiovascular Exercise Tolerance: Good (-) angina(-) CAD, (-) Past MI, (-) Cardiac Stents and (-) CABG negative cardio ROS   Rhythm:Regular Rate:Normal - Systolic murmurs and - Diastolic murmurs    Neuro/Psych negative psych ROS   GI/Hepatic Neg liver ROS, GERD  ,  Endo/Other  neg diabetesMorbid obesity  Renal/GU negative Renal ROS     Musculoskeletal negative musculoskeletal ROS (+)   Abdominal (+) + obese,   Peds  Hematology negative hematology ROS (+)   Anesthesia Other Findings Past Medical History: No date: Hypertension   Reproductive/Obstetrics                             Anesthesia Physical  Anesthesia Plan  ASA: III  Anesthesia Plan: General   Post-op Pain Management:    Induction: Intravenous  PONV Risk Score and Plan: 2 and Ondansetron, Midazolam and Dexamethasone  Airway Management Planned: Oral ETT  Additional Equipment:   Intra-op Plan:   Post-operative Plan: Extubation in OR  Informed Consent: I have reviewed the patients History and Physical, chart, labs and discussed the procedure including the risks, benefits and alternatives for the proposed anesthesia with the patient or authorized representative who has indicated his/her understanding and acceptance.     Dental advisory given  Plan  Discussed with: CRNA and Anesthesiologist  Anesthesia Plan Comments:         Anesthesia Quick Evaluation

## 2020-10-03 NOTE — Progress Notes (Signed)
Binder in place, pt states it feels better on. Instructions for D?C done and pt verbalizes understanding

## 2020-10-03 NOTE — Transfer of Care (Signed)
Immediate Anesthesia Transfer of Care Note  Patient: Natasha Carter  Procedure(s) Performed: LAPAROSCOPIC RIGHT SALPINGECTOMY WITH REMOVAL OF ECTOPIC PREGNANCY (Right Abdomen)  Patient Location: PACU  Anesthesia Type:General  Level of Consciousness: drowsy  Airway & Oxygen Therapy: Patient Spontanous Breathing and Patient connected to face mask oxygen  Post-op Assessment: Report given to RN and Post -op Vital signs reviewed and stable  Post vital signs: Reviewed and stable  Last Vitals:  Vitals Value Taken Time  BP 119/73 10/03/20 1715  Temp    Pulse 83 10/03/20 1716  Resp 19 10/03/20 1716  SpO2 100 % 10/03/20 1716  Vitals shown include unvalidated device data.  Last Pain:  Vitals:   10/03/20 1536  TempSrc: Oral  PainSc:          Complications: No complications documented.

## 2020-10-03 NOTE — Discharge Instructions (Signed)
Discharge instructions:  Call office if you have any of the following: fever >101 F, chills, shortness of breath, excessive vaginal bleeding, incision drainage or problems, leg pain or redness, or any other concerns.   Activity: Do not lift > 20 lbs for 2 weeks.  No driving until you are certain you can slam on the brakes, and of course never while taking narcotics.   You may feel some pain in your upper right abdomen/rib and right shoulder.  This is from the gas in the abdomen for surgery. This will subside over time, please be patient!  Take 600mg  Ibuprofen and 1000mg  Tylenol together, around the clock, every 6 hours for at least the first 3-5 days.  After this you can take as needed.  This will help decrease inflammation and promote healing.  The narcotics you'll take just as needed, as they just trick your brain into thinking its not in pain.  Wear your abdominal binder as often as you can, across your low abdomen incisions.  It is a great pain reliever, naturally!  Please don't limit yourself in terms of routine activity.  You will be able to do most things, although they may take longer to do or be a little painful.  You can do it!  Don't be a hero, but don't be a wimp either!

## 2020-10-03 NOTE — Anesthesia Procedure Notes (Signed)
Procedure Name: Intubation Date/Time: 10/03/2020 4:03 PM Performed by: Lynden Oxford, CRNA Pre-anesthesia Checklist: Patient identified, Emergency Drugs available, Suction available and Patient being monitored Patient Re-evaluated:Patient Re-evaluated prior to induction Oxygen Delivery Method: Circle system utilized Preoxygenation: Pre-oxygenation with 100% oxygen Induction Type: IV induction Ventilation: Mask ventilation without difficulty Laryngoscope Size: McGraph and 3 Grade View: Grade I Tube type: Oral Tube size: 7.0 mm Number of attempts: 1 Airway Equipment and Method: Stylet and Video-laryngoscopy Placement Confirmation: ETT inserted through vocal cords under direct vision,  positive ETCO2 and breath sounds checked- equal and bilateral Secured at: 21 cm Tube secured with: Tape Dental Injury: Teeth and Oropharynx as per pre-operative assessment

## 2020-10-03 NOTE — Anesthesia Postprocedure Evaluation (Signed)
Anesthesia Post Note  Patient: Psychologist, occupational  Procedure(s) Performed: LAPAROSCOPIC RIGHT SALPINGECTOMY WITH REMOVAL OF ECTOPIC PREGNANCY (Right Abdomen)  Patient location during evaluation: PACU Anesthesia Type: General Level of consciousness: awake and alert Pain management: pain level controlled Vital Signs Assessment: post-procedure vital signs reviewed and stable Respiratory status: spontaneous breathing, nonlabored ventilation, respiratory function stable and patient connected to nasal cannula oxygen Cardiovascular status: blood pressure returned to baseline and stable Postop Assessment: no apparent nausea or vomiting Anesthetic complications: no   No complications documented.   Last Vitals:  Vitals:   10/03/20 1818 10/03/20 1845  BP:  120/72  Pulse:  84  Resp:  20  Temp: 37.4 C   SpO2:  96%    Last Pain:  Vitals:   10/03/20 1947  TempSrc:   PainSc: Asleep                 Lenard Simmer

## 2020-10-03 NOTE — H&P (Addendum)
Day-of-Surgery Preoperative History and Physical   Natasha Carter is a 30 y.o. G1P0000 here for surgical management of symptomatic ectopic pregnancy.   Preoperative concerns have been addressed.  Patient presented to office for continued surveillance for ectopic pregnancy / PUL. Vaginal bleeding yesterday + cramping and new onset  RLQ pain  Beta 4k 2/14 --> 6k  2/17  --> 12k today 2/24 [place] today: GS seen in right adnexa adjacent to ovary No embryo  MTX was being considered although high beta. Patient has been symptomatic on right side x 2 days and now during visit today symptoms gradually worsening.  BP 130/90.  Also has childcare barrier, and other option besides surgery would be in-patient observation with multi-dose MTX and leucovorin rescue. This is an inferior option given her worsening pain, and was discussed with patient as such.  Recommending laparoscopy with salpingectomy vs salpingostomy.   NPO since 8pm   Patient being directly admitted to [facility]  ABO: O, Rh negative Rhogam administered today. Labs - CBC and CMP done today with results in Care Everywhere.  Proposed surgery: Laparoscopic Salpingectomy vs Salpingostomy  Past Medical History:  Diagnosis Date  . Hypertension    Past Surgical History:  Procedure Laterality Date  . DILATION AND CURETTAGE OF UTERUS N/A 12/17/2017   Procedure: DILATATION AND CURETTAGE;  Surgeon: Carter, Natasha Austin, MD;  Location: [facility] ORS;  Service: Gynecology;  Laterality: N/A;  . NO PAST SURGERIES     OB History  Gravida Para Term Preterm AB Living  0 0 0 0 0 0  SAB IAB Ectopic Multiple Live Births  0 0 0 0 0  Patient denies any other pertinent gynecologic issues.   No current facility-administered medications on file prior to encounter.   Current Outpatient Medications on File Prior to Encounter  Medication Sig Dispense Refill  . Ferrous Sulfate (IRON SLOW RELEASE) 140 (45 Fe) MG TBCR Take 140 mg by mouth daily.    Marland Kitchen ibuprofen  (ADVIL,MOTRIN) 200 MG tablet Take 600 mg by mouth every 8 (eight) hours as needed (for pain).    Marland Kitchen lansoprazole (PREVACID) 15 MG capsule Take 15 mg by mouth daily at 12 noon.    . norethindrone (CAMILA) 0.35 MG tablet Take 1 tablet by mouth 2 (two) times daily.    . ondansetron (ZOFRAN) 8 MG tablet Take 8 mg by mouth every 4 (four) hours as needed for nausea or vomiting.    . phentermine 37.5 MG capsule Take 37.5 mg by mouth daily before breakfast.     No Known Allergies  Social History:   reports that she has never smoked. She has never used smokeless tobacco. She reports that she does not drink alcohol and does not use drugs.  No family history on file.  No gyn cancers  Review of Systems: Noncontributory  PHYSICAL EXAM: Blood pressure (!) 169/94, pulse 89, temperature 98.2 F (36.8 C), temperature source Oral, resp. rate 20, SpO2 99 %. General appearance - alert, well appearing, and in no distress Chest - clear to auscultation, symmetric air entry, normal respiratory effort Heart - normal rate and regular rhythm Abdomen - soft, nontender, non-distended Pelvic - examination not performed in preop area Extremities - peripheral pulses normal, no pedal edema, no clubbing or cyanosis  Labs: No results found for this or any previous visit (from the past 336 hour(s)).  Imaging Studies: No results found.  Assessment: Patient Active Problem List   Diagnosis Date Noted  . Pregnancy with uncertain fetal viability 10/03/2020  .  Ectopic pregnancy without intrauterine pregnancy 10/03/2020    Plan: Patient will undergo surgical management with Laparoscopic Salpingectomy vs Salpingostomy, presumed right, but intentionally not specified.   The risks of surgery were discussed in detail with the patient including but not limited to: bleeding which may require transfusion or reoperation; infection which may require antibiotics; injury to surrounding organs which may involve bowel, bladder,  ureters ; need for additional procedures includin laparotomy; thromboembolic phenomenon, surgical site problems and other postoperative/anesthesia complications. Likelihood of success in alleviating the patient's condition was discussed. Routine postoperative instructions will be reviewed with the patient and her family in detail after surgery.  The patient concurred with the proposed plan, giving informed written consent for the surgery.  Patient has been NPO since last night she will remain NPO for procedure.  Anesthesia and OR aware.  SCDs ordered on call to the OR.  To OR when ready.  As she is hemodynamically stable, we will wait for COVID testing results before proceeding with surgery for staff safety precautions and appropriate PPE/staffing.  The procedure will not be delayed based on results.   ----- Natasha Plumber, MD, FACOG Attending Obstetrician and Gynecologist [facility], Department of OB/GYN [facility]  10/03/2020 1:47 PM

## 2020-10-03 NOTE — Discharge Summary (Signed)
Gynecology Discharge Summary  Patient ID: Natasha Carter MRN: [id number] DOB/AGE: [birth year] 32 y.o.  Admit Date: 10/03/2020 Discharge Date: 10/03/2020  Preoperative Diagnoses: right ectopic pregnancy Postoperative Diagnoses: right ectopic pregnancy    Procedures: Procedure(s) (LRB): LAPAROSCOPIC RIGHT SALPINGECTOMY WITH REMOVAL OF ECTOPIC PREGNANCY (Right)  CBC Latest Ref Rng & Units 12/17/2017 09/15/2017  WBC 3.6 - 11.0 K/uL 6.6 6.4  Hemoglobin 12.0 - 16.0 g/dL 01.0 27.2  Hematocrit 53.6 - 47.0 % 40.0 40.5  Platelets 150 - 440 K/uL 249 244    Hospital Course:  Natasha Carter is a 30 y.o. G1P0010  admitted for emergent surgery.  She underwent the procedures as mentioned above, her operation was uncomplicated. For further details about surgery, please refer to the operative report. Patient had an uncomplicated postoperative course. By time of discharge on POD#0, her pain was controlled on oral pain medications; she was ambulating, voiding without difficulty, tolerating regular diet and passing flatus. She was deemed stable for discharge to home.   She was admitted and discharged from a floor room, which necessitates this note.  She was discharged per PACU criteria.   Discharge Exam: BP 120/72   Pulse 84   Temp 99.4 F (37.4 C)   Resp 20   Ht 5\' 5"  (1.651 m)   Wt 117.9 kg   SpO2 96%   BMI 43.27 kg/m  General appearance: alert and no distress  Resp: clear to auscultation bilaterally, normal respiratory effort Cardio: regular rate and rhythm  GI: soft, non-tender; bowel sounds normal; no masses, no organomegaly.  Incision: C/D/I, no erythema, no drainage noted Pelvic: scant blood on pad  Extremities: extremities normal, atraumatic, no cyanosis or edema and Homans sign is negative, no sign of DVT  Discharged Condition: Stable  Disposition: Discharge disposition: 01-Home or Self Care        Allergies as of 10/03/2020   No Known Allergies     Medication List    STOP taking  these medications   Camila 0.35 MG tablet Generic drug: norethindrone     TAKE these medications   ibuprofen 600 MG tablet Commonly known as: ADVIL Take 1 tablet (600 mg total) by mouth every 6 (six) hours. What changed:   medication strength  when to take this  reasons to take this   Iron Slow Release 140 (45 Fe) MG Tbcr Generic drug: Ferrous Sulfate Take 140 mg by mouth daily.   lansoprazole 15 MG capsule Commonly known as: PREVACID Take 15 mg by mouth daily at 12 noon.   oxyCODONE 5 MG immediate release tablet Commonly known as: Roxicodone Take 1 tablet (5 mg total) by mouth every 4 (four) hours as needed.   phentermine 37.5 MG capsule Take 37.5 mg by mouth daily before breakfast.   Zofran 8 MG tablet Generic drug: ondansetron Take 8 mg by mouth every 4 (four) hours as needed for nausea or vomiting.       Follow-up Information    Natasha Carter, 10/05/2020, MD Follow up in 2 week(s).   Specialty: Obstetrics and Gynecology Contact information: [address] [place] [phone]               Signed:  [phone] Natasha Carter Attending Obstetrician & Gynecologist [facility]

## 2020-10-04 ENCOUNTER — Encounter: Payer: Self-pay | Admitting: Obstetrics & Gynecology

## 2020-10-05 ENCOUNTER — Emergency Department
Admission: EM | Admit: 2020-10-05 | Discharge: 2020-10-05 | Disposition: A | Discharge status: Home / Self Care | Payer: BC Managed Care – PPO | Attending: Emergency Medicine | Admitting: Emergency Medicine

## 2020-10-05 ENCOUNTER — Other Ambulatory Visit: Payer: Self-pay

## 2020-10-05 ENCOUNTER — Emergency Department: Payer: BC Managed Care – PPO

## 2020-10-05 DIAGNOSIS — R109 Unspecified abdominal pain: Secondary | ICD-10-CM | POA: Diagnosis not present

## 2020-10-05 DIAGNOSIS — N938 Other specified abnormal uterine and vaginal bleeding: Secondary | ICD-10-CM | POA: Diagnosis not present

## 2020-10-05 DIAGNOSIS — R251 Tremor, unspecified: Secondary | ICD-10-CM | POA: Diagnosis not present

## 2020-10-05 DIAGNOSIS — G8918 Other acute postprocedural pain: Secondary | ICD-10-CM | POA: Insufficient documentation

## 2020-10-05 DIAGNOSIS — I10 Essential (primary) hypertension: Secondary | ICD-10-CM | POA: Insufficient documentation

## 2020-10-05 DIAGNOSIS — R42 Dizziness and giddiness: Secondary | ICD-10-CM | POA: Diagnosis not present

## 2020-10-05 DIAGNOSIS — N939 Abnormal uterine and vaginal bleeding, unspecified: Secondary | ICD-10-CM

## 2020-10-05 DIAGNOSIS — R1032 Left lower quadrant pain: Secondary | ICD-10-CM | POA: Diagnosis not present

## 2020-10-05 LAB — COMPREHENSIVE METABOLIC PANEL
ALT: 18 U/L (ref 0–44)
AST: 23 U/L (ref 15–41)
Albumin: 3.9 g/dL (ref 3.5–5.0)
Alkaline Phosphatase: 33 U/L — ABNORMAL LOW (ref 38–126)
Anion gap: 7 (ref 5–15)
BUN: 11 mg/dL (ref 6–20)
CO2: 25 mmol/L (ref 22–32)
Calcium: 8.7 mg/dL — ABNORMAL LOW (ref 8.9–10.3)
Chloride: 107 mmol/L (ref 98–111)
Creatinine, Ser: 0.77 mg/dL (ref 0.44–1.00)
GFR, Estimated: >60 mL/min (ref >60)
Glucose, Bld: 98 mg/dL (ref 70–99)
Potassium: 3.7 mmol/L (ref 3.5–5.1)
Sodium: 139 mmol/L (ref 135–145)
Total Bilirubin: 0.5 mg/dL (ref 0.3–1.2)
Total Protein: 7 g/dL (ref 6.5–8.1)

## 2020-10-05 LAB — URINALYSIS, COMPLETE (UACMP) WITH MICROSCOPIC
Bacteria, UA: NONE SEEN
Bilirubin Urine: NEGATIVE
Glucose, UA: NEGATIVE mg/dL
Ketones, ur: NEGATIVE mg/dL
Nitrite: NEGATIVE
Protein, ur: NEGATIVE mg/dL
RBC / HPF: >50 RBC/hpf — ABNORMAL HIGH (ref 0–5)
Specific Gravity, Urine: 1.013 (ref 1.005–1.030)
pH: 5 (ref 5.0–8.0)

## 2020-10-05 LAB — CBC
HCT: 40.8 % (ref 36.0–46.0)
Hemoglobin: 13.5 g/dL (ref 12.0–15.0)
MCH: 29.2 pg (ref 26.0–34.0)
MCHC: 33.1 g/dL (ref 30.0–36.0)
MCV: 88.3 fL (ref 80.0–100.0)
Platelets: 237 10*3/uL (ref 150–400)
RBC: 4.62 MIL/uL (ref 3.87–5.11)
RDW: 12.9 % (ref 11.5–15.5)
WBC: 9 10*3/uL (ref 4.0–10.5)
nRBC: 0 % (ref 0.0–0.2)

## 2020-10-05 LAB — LIPASE, BLOOD: Lipase: 25 U/L (ref 11–51)

## 2020-10-05 MED ORDER — FENTANYL CITRATE (PF) 100 MCG/2ML IJ SOLN
50.0000 ug | Freq: Once | INTRAMUSCULAR | Status: AC
  Administered 2020-10-05: 50 ug via INTRAVENOUS
  Filled 2020-10-05 (×2): qty 2

## 2020-10-05 MED ORDER — IOHEXOL 300 MG/ML  SOLN
100.0000 mL | Freq: Once | INTRAMUSCULAR | Status: AC | PRN
  Administered 2020-10-05: 100 mL via INTRAVENOUS

## 2020-10-05 MED ORDER — ONDANSETRON HCL 4 MG/2ML IJ SOLN
4.0000 mg | Freq: Once | INTRAMUSCULAR | Status: AC
  Administered 2020-10-05: 4 mg via INTRAVENOUS
  Filled 2020-10-05: qty 2

## 2020-10-05 MED ORDER — SODIUM CHLORIDE 0.9 % IV BOLUS
1000.0000 mL | Freq: Once | INTRAVENOUS | Status: AC
  Administered 2020-10-05: 1000 mL via INTRAVENOUS

## 2020-10-05 NOTE — ED Notes (Signed)
Patient refused Fentanyl. Dr. Derrill Kay aware.

## 2020-10-05 NOTE — ED Notes (Signed)
Patient ambulated with slow, steady gait across the hall to the bathroom. Husband is at bedside.

## 2020-10-05 NOTE — Discharge Instructions (Addendum)
Please seek medical attention for any high fevers, chest pain, shortness of breath, change in behavior, persistent vomiting, bloody stool or any other new or concerning symptoms.  

## 2020-10-05 NOTE — ED Provider Notes (Signed)
[facility] Emergency Department Provider Note   ____________________________________________   I have reviewed the triage vital signs and the nursing notes.   HISTORY  Chief Complaint Post-op Problem   History limited by: Not Limited   HPI Natasha Carter is a 30 y.o. female who presents to the emergency department today because of concerns for increased pelvic pain as well as vaginal bleeding after a surgery for an ectopic pregnancy 2 days ago. She states that the pain started getting worse and becoming severe this morning around 3 am. This has been accompanied by vaginal bleeding. She states that it is heavier than her normal period bleeding. The patient also has felt shaky and dizzy today.    Records reviewed. Per medical record review patient has a history of recent right sided ectopic pregnancy with removal via laparoscopic surgery.   Past Medical History:  Diagnosis Date  . Hypertension     Patient Active Problem List   Diagnosis Date Noted  . Pregnancy with uncertain fetal viability 10/03/2020  . Ectopic pregnancy without intrauterine pregnancy 10/03/2020    Past Surgical History:  Procedure Laterality Date  . DIAGNOSTIC LAPAROSCOPY WITH REMOVAL OF ECTOPIC PREGNANCY Right 10/03/2020   Procedure: LAPAROSCOPIC RIGHT SALPINGECTOMY WITH REMOVAL OF ECTOPIC PREGNANCY;  Surgeon: Ward, Honor Loh, MD;  Location: [facility] ORS;  Service: Gynecology;  Laterality: Right;  . DILATION AND CURETTAGE OF UTERUS N/A 12/17/2017   Procedure: DILATATION AND CURETTAGE;  Surgeon: Schermerhorn, Gwen Her, MD;  Location: [facility] ORS;  Service: Gynecology;  Laterality: N/A;  . NO PAST SURGERIES      Prior to Admission medications   Medication Sig Start Date End Date Taking? Authorizing Provider  Ferrous Sulfate (IRON SLOW RELEASE) 140 (45 Fe) MG TBCR Take 140 mg by mouth daily.    [provider]  ibuprofen (ADVIL) 600 MG tablet Take 1 tablet (600 mg total) by mouth every  6 (six) hours. 10/03/20   Ward, Honor Loh, MD  lansoprazole (PREVACID) 15 MG capsule Take 15 mg by mouth daily at 12 noon.    [provider]  ondansetron (ZOFRAN) 8 MG tablet Take 8 mg by mouth every 4 (four) hours as needed for nausea or vomiting.    [provider]  oxyCODONE (ROXICODONE) 5 MG immediate release tablet Take 1 tablet (5 mg total) by mouth every 4 (four) hours as needed. 10/03/20 10/03/21  Ward, Honor Loh, MD  phentermine 37.5 MG capsule Take 37.5 mg by mouth daily before breakfast.    [provider]    Allergies Patient has no known allergies.  History reviewed. No pertinent family history.  Social History Social History   Tobacco Use  . Smoking status: Never Smoker  . Smokeless tobacco: Never Used  Vaping Use  . Vaping Use: Never used  Substance Use Topics  . Alcohol use: No  . Drug use: No    Review of Systems Constitutional: No fever/chills Eyes: No visual changes. ENT: No sore throat. Cardiovascular: Denies chest pain. Respiratory: Denies shortness of breath. Gastrointestinal: Positive for left lower quadrant abd pain. Genitourinary: Positive for vaginal bleeding.  Musculoskeletal: Negative for back pain. Skin: Negative for rash. Neurological: Negative for headaches, focal weakness or numbness.  ____________________________________________   PHYSICAL EXAM:  VITAL SIGNS: ED Triage Vitals  Enc Vitals Group     BP 10/05/20 1347 (!) 137/96     Pulse Rate 10/05/20 1347 87     Resp 10/05/20 1347 18     Temp 10/05/20 1347  97.9 F (36.6 C)     Temp Source 10/05/20 1347 Oral     SpO2 10/05/20 1347 99 %     Weight 10/05/20 1346 260 lb (117.9 kg)     Height 10/05/20 1346 _0  (1.651 m)     Head Circumference --      Peak Flow --      Pain Score 10/05/20 1346 8   Constitutional: Alert and oriented.  Eyes: Conjunctivae are normal.  ENT      Head: Normocephalic and atraumatic.      Nose: No congestion/rhinnorhea.       Mouth/Throat: Mucous membranes are moist.      Neck: No stridor. Hematological/Lymphatic/Immunilogical: No cervical lymphadenopathy. Cardiovascular: Normal rate, regular rhythm.  No murmurs, rubs, or gallops.  Respiratory: Normal respiratory effort without tachypnea nor retractions. Breath sounds are clear and equal bilaterally. No wheezes/rales/rhonchi. Gastrointestinal: Soft and tender to palpation in the left lower quadrant.  Genitourinary: Deferred Musculoskeletal: Normal range of motion in all extremities. No lower extremity edema. Neurologic:  Normal speech and language. No gross focal neurologic deficits are appreciated.  Skin:  Skin is warm, dry and intact. No rash noted. Psychiatric: Mood and affect are normal. Speech and behavior are normal. Patient exhibits appropriate insight and judgment.  ____________________________________________    LABS (pertinent positives/negatives)  Lipase 25 CBC wbc 9.0, hgb 13.5, plt 237 CMP wnl except ca 8.7, alk phos 33  ____________________________________________   EKG  None  ____________________________________________    RADIOLOGY  CT abd./pel No acute abnormality  ____________________________________________   PROCEDURES  Procedures  ____________________________________________   INITIAL IMPRESSION / ASSESSMENT AND PLAN / ED COURSE  Pertinent labs & imaging results that were available during my care of the patient were reviewed by me and considered in my medical decision making (see chart for details).   Patient presented to the emergency department today because of concerns for abdominal pain and vaginal bleeding in the setting of a recent ectopic pregnancy surgery.  Patient without any fever here.  Blood work without any anemia and patient is not hypotensive.  CT scan was obtained which did not show any acute abnormality I discussed with Dr. Leafy Ro with OB/GYN.  I did offer birth control to the patient to help with  her symptoms however she declined at this time.  Did reassure patient given negative CT that there is no evidence of infection or internal bleeding.  ____________________________________________   FINAL CLINICAL IMPRESSION(S) / ED DIAGNOSES  Final diagnoses:  Post-operative pain  Vaginal bleeding     Note: This dictation was prepared with Dragon dictation. Any transcriptional errors that result from this process are unintentional     Nance Pear, MD 10/05/20 2122

## 2020-10-05 NOTE — ED Notes (Signed)
Patient taken to CT scan.

## 2020-10-05 NOTE — ED Triage Notes (Signed)
Pt states that she had surgery for an ectopic pregnancy on Thursday- pt now has bright red bleeding, dizziness, and increased pain in her lower abdomen- pt states that she heard a "pop" noise with the nurse before she was discharged and was told she popped a stitch but is concerned that it is something else now

## 2020-10-07 LAB — SURGICAL PATHOLOGY

## 2020-10-08 LAB — URINE CULTURE: Culture: 20000 — AB

## 2020-10-09 NOTE — Progress Notes (Signed)
ED Antimicrobial Stewardship Positive Culture Follow Up   Natasha Carter is an 30 y.o. female who presented to [facility] on 10/05/2020 with a chief complaint of  Chief Complaint  Patient presents with  . Post-op Problem    Recent Results (from the past 720 hour(s))  SARS Coronavirus 2 by RT PCR (hospital order, performed in [facility] hospital lab) Nasopharyngeal Nasopharyngeal Swab     Status: None   Collection Time: 10/03/20  1:40 PM   Specimen: Nasopharyngeal Swab  Result Value Ref Range Status   SARS Coronavirus 2 NEGATIVE NEGATIVE Final    Comment: (NOTE) SARS-CoV-2 target nucleic acids are NOT DETECTED.  The SARS-CoV-2 RNA is generally detectable in upper and lower respiratory specimens during the acute phase of infection. The lowest concentration of SARS-CoV-2 viral copies this assay can detect is 250 copies / mL. A negative result does not preclude SARS-CoV-2 infection and should not be used as the sole basis for treatment or other patient management decisions.  A negative result may occur with improper specimen collection / handling, submission of specimen other than nasopharyngeal swab, presence of viral mutation(s) within the areas targeted by this assay, and inadequate number of viral copies (<250 copies / mL). A negative result must be combined with clinical observations, patient history, and epidemiological information.  Fact Sheet for Patients:   BoilerBrush.com.cy  Fact Sheet for Healthcare Providers: https://pope.com/  This test is not yet approved or  cleared by the [place] FDA and has been authorized for detection and/or diagnosis of SARS-CoV-2 by FDA under an Emergency Use Authorization (EUA).  This EUA will remain in effect (meaning this test can be used) for the duration of the COVID-19 declaration under Section 564(b)(1) of the Act, 21 U.S.C. section 360bbb-3(b)(1), unless the authorization is terminated  or revoked sooner.  Performed at [facility], [address] [place], [place] [address]   Urine culture     Status: Abnormal   Collection Time: 10/05/20  5:59 PM   Specimen: Urine, Random  Result Value Ref Range Status   Specimen Description   Final    URINE, RANDOM Performed at [facility], [address], [place], [place] [address]    Special Requests   Final    NONE Performed at [facility], [address] [place], [place] [address]    Culture 20,000 COLONIES/mL ENTEROBACTER AEROGENES (A)  Final   Report Status 10/08/2020 FINAL  Final   Organism ID, Bacteria ENTEROBACTER AEROGENES (A)  Final      Susceptibility   Enterobacter aerogenes - MIC*    CEFAZOLIN >=64 RESISTANT Resistant     CEFEPIME <=0.12 SENSITIVE Sensitive     CEFTRIAXONE <=0.25 SENSITIVE Sensitive     CIPROFLOXACIN <=0.25 SENSITIVE Sensitive     GENTAMICIN <=1 SENSITIVE Sensitive     IMIPENEM 0.5 SENSITIVE Sensitive     NITROFURANTOIN 64 INTERMEDIATE Intermediate     TRIMETH/SULFA <=20 SENSITIVE Sensitive     PIP/TAZO <=4 SENSITIVE Sensitive     * 20,000 COLONIES/mL ENTEROBACTER AEROGENES   30 yo F presented with vaginal bleeding, dizziness, and increased lower abdominal pain. S/p surgery for ectopic pregnancy on 2/24. No urinary symptoms. Not treating since asymptomatic.   New antibiotic prescription: None  ED Provider: Raeanne Gathers, PharmD Pharmacy Resident  10/09/2020 3:20 PM

## 2020-10-21 DIAGNOSIS — Z87891 Personal history of nicotine dependence: Secondary | ICD-10-CM | POA: Diagnosis not present

## 2020-10-21 DIAGNOSIS — R0789 Other chest pain: Secondary | ICD-10-CM | POA: Diagnosis not present

## 2020-10-21 DIAGNOSIS — Z20822 Contact with and (suspected) exposure to covid-19: Secondary | ICD-10-CM | POA: Diagnosis not present

## 2020-10-21 DIAGNOSIS — I10 Essential (primary) hypertension: Secondary | ICD-10-CM | POA: Diagnosis not present

## 2020-10-21 DIAGNOSIS — R0781 Pleurodynia: Secondary | ICD-10-CM | POA: Diagnosis not present

## 2020-10-21 DIAGNOSIS — R059 Cough, unspecified: Secondary | ICD-10-CM | POA: Diagnosis not present

## 2020-10-21 DIAGNOSIS — J129 Viral pneumonia, unspecified: Secondary | ICD-10-CM | POA: Diagnosis not present

## 2020-10-21 DIAGNOSIS — R062 Wheezing: Secondary | ICD-10-CM | POA: Diagnosis not present

## 2020-10-21 DIAGNOSIS — R9431 Abnormal electrocardiogram [ECG] [EKG]: Secondary | ICD-10-CM | POA: Diagnosis not present

## 2020-10-21 DIAGNOSIS — R918 Other nonspecific abnormal finding of lung field: Secondary | ICD-10-CM | POA: Diagnosis not present

## 2020-10-21 DIAGNOSIS — J189 Pneumonia, unspecified organism: Secondary | ICD-10-CM | POA: Diagnosis not present

## 2020-10-21 DIAGNOSIS — R0602 Shortness of breath: Secondary | ICD-10-CM | POA: Diagnosis not present

## 2020-10-24 ENCOUNTER — Telehealth: Payer: Self-pay

## 2020-10-24 NOTE — Telephone Encounter (Signed)
We have received records from [facility] for transfer care. Called and left voicemail for patient to call back to be scheduled.

## 2020-10-25 ENCOUNTER — Other Ambulatory Visit: Payer: Self-pay | Admitting: Obstetrics & Gynecology

## 2021-02-14 ENCOUNTER — Other Ambulatory Visit (HOSPITAL_COMMUNITY)
Admission: RE | Admit: 2021-02-14 | Discharge: 2021-02-14 | Disposition: A | Discharge status: Home / Self Care | Payer: BC Managed Care – PPO | Source: Ambulatory Visit | Attending: Obstetrics & Gynecology | Admitting: Obstetrics & Gynecology

## 2021-02-14 ENCOUNTER — Encounter: Payer: Self-pay | Admitting: Obstetrics & Gynecology

## 2021-02-14 ENCOUNTER — Other Ambulatory Visit: Payer: Self-pay

## 2021-02-14 ENCOUNTER — Ambulatory Visit (INDEPENDENT_AMBULATORY_CARE_PROVIDER_SITE_OTHER): Payer: BC Managed Care – PPO | Admitting: Obstetrics & Gynecology

## 2021-02-14 VITALS — BP 130/80 | Ht 65.0 in | Wt 174.0 lb

## 2021-02-14 DIAGNOSIS — Z124 Encounter for screening for malignant neoplasm of cervix: Secondary | ICD-10-CM | POA: Insufficient documentation

## 2021-02-14 DIAGNOSIS — R1032 Left lower quadrant pain: Secondary | ICD-10-CM | POA: Diagnosis not present

## 2021-02-14 DIAGNOSIS — Z01419 Encounter for gynecological examination (general) (routine) without abnormal findings: Secondary | ICD-10-CM

## 2021-02-14 NOTE — Progress Notes (Signed)
HPI:      Ms. Russia Scheiderer is a 30 y.o. G1P0010 who LMP was Patient's last menstrual period was 02/13/2021., she presents today for her annual examination. The patient has concerns over her healing and related pains after surgery for Right sided ectopic a few mos ago (10/29/20).  She had difficult time leading up to diagnosis (managed at [facility]), and then had surgery w Salpingectomy.  Desires pregnancies ion future, and is not on birth control currently.  Reports irregular cycles.  She has had moderate to severe amount of LLQ pain that is more medial and deep to LLQ incision from surgery, not in her ovary or groin area.  Worse w activity but also worse w menses.  No radiation.  No modifiers other than rest.   The patient is sexually active. Her last pap: was normal. The patient does perform self breast exams.  There is no notable family history of breast or ovarian cancer in her family.  The patient has regular exercise: yes.  The patient denies current symptoms of depression.    GYN History: Contraception: none  PMHx: Past Medical History:  Diagnosis Date   Hypertension    Past Surgical History:  Procedure Laterality Date   DIAGNOSTIC LAPAROSCOPY WITH REMOVAL OF ECTOPIC PREGNANCY Right 10/03/2020   Procedure: LAPAROSCOPIC RIGHT SALPINGECTOMY WITH REMOVAL OF ECTOPIC PREGNANCY;  Surgeon: Ward, Elenora Fender, MD;  Location: [facility] ORS;  Service: Gynecology;  Laterality: Right;   DILATION AND CURETTAGE OF UTERUS N/A 12/17/2017   Procedure: DILATATION AND CURETTAGE;  Surgeon: Schermerhorn, Ihor Austin, MD;  Location: [facility] ORS;  Service: Gynecology;  Laterality: N/A;   NO PAST SURGERIES     History reviewed. No pertinent family history. Social History   Tobacco Use   Smoking status: Never   Smokeless tobacco: Never  Vaping Use   Vaping Use: Never used  Substance Use Topics   Alcohol use: No   Drug use: No    Current Outpatient Medications:    Ferrous Sulfate 140 (45 Fe) MG TBCR, Take 140  mg by mouth daily. (Patient not taking: Reported on 02/14/2021), Disp: , Rfl:    ibuprofen (ADVIL) 600 MG tablet, Take 1 tablet (600 mg total) by mouth every 6 (six) hours. (Patient not taking: Reported on 02/14/2021), Disp: 45 tablet, Rfl: 1   lansoprazole (PREVACID) 15 MG capsule, Take 15 mg by mouth daily at 12 noon. (Patient not taking: Reported on 02/14/2021), Disp: , Rfl:    ondansetron (ZOFRAN) 8 MG tablet, Take 8 mg by mouth every 4 (four) hours as needed for nausea or vomiting. (Patient not taking: Reported on 02/14/2021), Disp: , Rfl:    oxyCODONE (ROXICODONE) 5 MG immediate release tablet, Take 1 tablet (5 mg total) by mouth every 4 (four) hours as needed. (Patient not taking: Reported on 02/14/2021), Disp: 11 tablet, Rfl: 0   phentermine 37.5 MG capsule, Take 37.5 mg by mouth daily before breakfast. (Patient not taking: Reported on 02/14/2021), Disp: , Rfl:  Allergies: Patient has no known allergies.  Review of Systems  Constitutional:  Negative for chills, fever and malaise/fatigue.  HENT:  Negative for congestion, sinus pain and sore throat.   Eyes:  Negative for blurred vision and pain.  Respiratory:  Positive for wheezing. Negative for cough.   Cardiovascular:  Negative for chest pain and leg swelling.  Gastrointestinal:  Negative for abdominal pain, constipation, diarrhea, heartburn, nausea and vomiting.  Genitourinary:  Negative for dysuria, frequency, hematuria and urgency.  Musculoskeletal:  Negative  for back pain, joint pain, myalgias and neck pain.  Skin:  Negative for itching and rash.  Neurological:  Positive for dizziness and headaches. Negative for tremors and weakness.  Endo/Heme/Allergies:  Does not bruise/bleed easily.  Psychiatric/Behavioral:  Negative for depression. The patient is not nervous/anxious and does not have insomnia.    Objective: BP 130/80   Ht 5\' 5"  (1.651 m)   Wt 174 lb (78.9 kg)   LMP 02/13/2021   BMI 28.96 kg/m   Filed Weights   02/14/21 0906   Weight: 174 lb (78.9 kg)   Body mass index is 28.96 kg/m. Physical Exam Constitutional:      General: She is not in acute distress.    Appearance: She is well-developed.  Genitourinary:     Bladder, rectum and urethral meatus normal.     No lesions in the vagina.     Right Labia: No rash, tenderness or lesions.    Left Labia: No tenderness, lesions or rash.    No vaginal bleeding.      Right Adnexa: not tender and no mass present.    Left Adnexa: not tender and no mass present.    No cervical motion tenderness, friability, lesion or polyp.     Uterus is not enlarged.     No uterine mass detected.    Pelvic exam was performed with patient in the lithotomy position.  Breasts:    Right: No mass, skin change or tenderness.     Left: No mass, skin change or tenderness.  HENT:     Head: Normocephalic and atraumatic. No laceration.     Right Ear: Hearing normal.     Left Ear: Hearing normal.     Mouth/Throat:     Pharynx: Uvula midline.  Eyes:     Pupils: Pupils are equal, round, and reactive to light.  Neck:     Thyroid: No thyromegaly.  Cardiovascular:     Rate and Rhythm: Normal rate and regular rhythm.     Heart sounds: No murmur heard.   No friction rub. No gallop.  Pulmonary:     Effort: Pulmonary effort is normal. No respiratory distress.     Breath sounds: Normal breath sounds. No wheezing.  Abdominal:     General: Bowel sounds are normal. There is no distension.     Palpations: Abdomen is soft.     Tenderness: There is abdominal tenderness in the left lower quadrant. There is no rebound. Negative signs include Murphy's sign, Rovsing's sign and McBurney's sign.     Hernia: No hernia is present.    Musculoskeletal:        General: Normal range of motion.     Cervical back: Normal range of motion and neck supple.  Neurological:     Mental Status: She is alert and oriented to person, place, and time.     Cranial Nerves: No cranial nerve deficit.  Skin:     General: Skin is warm and dry.  Psychiatric:        Judgment: Judgment normal.  Vitals reviewed.    Assessment: 1. Women's annual routine gynecological examination   2. Screening for cervical cancer   3. LLQ pain    1.  Cervical Screening-  Pap smear done today  2. Breast screening- Exam annually and mammogram>40 planned   3. Labs managed by PCP  4. Counseling for contraception: no method  The pregnancy intention screening data noted above was reviewed. Potential methods of contraception were discussed. The patient  elected to proceed with Pregnant/Seeking Pregnancy.   Cont PNV Weight loss discussed Risks of future left sided ectopic counseled    Early pregnancy monitoring advised  6. LLQ pain - Concern for incisional pain, small hernia (none palpated today), adhesions, other etiology; low likelihood for tubal or ovarian etiology; will check [place] then consider Gen Surgery referral - US PELVIC COMPLETE WITH TRANSVAGINAL; Future     F/U  Return for Sch GYN [place] then TELE F/U.  Annamarie Major, MD, [facility], [facility] 02/14/2021  10:11 AM

## 2021-02-14 NOTE — Patient Instructions (Signed)
PAP every three years Labs yearly (with PCP)  Thank you for choosing [facility]. As part of our ongoing efforts to improve patient experience, we would appreciate your feedback. Please fill out the short survey that you will receive by mail or MyChart. Your opinion is important to [place]! - Dr. Tiburcio Pea  Transvaginal Ultrasound A transvaginal ultrasound, also called an endovaginal ultrasound, is a test that uses sound waves to take pictures of the female genital tract. The pictures are taken with a device, called a transducer, that is placed in thevagina. This test may be done to: Check for problems with your pregnancy. Check your developing baby. Check for anything abnormal in the uterus or ovaries. Find out why you have pelvic pain or bleeding. Tell a health care provider about: Any allergies you have. All medicines you are taking, including vitamins, herbs, eye drops, creams, and over-the-counter medicines. Any blood disorders you have. Any surgeries you have had. Any medical conditions you have. Whether you are pregnant or may be pregnant. Whether you are having your menstrual period. What are the risks? This is a safe procedure. There are no known risks or complications of havingthis test. What happens before the procedure? This procedure needs to be done when your bladder is empty. Follow your health care provider's instructions about drinking fluids and emptying your bladderbefore the test. What happens during the procedure?  You will empty your bladder before the procedure. You will undress from the waist down. You will lie down on an exam table, with your knees bent and your feet in foot holders. A health care provider will cover the transducer with a sterile cover. A gel will be put on the transducer. The gel helps transmit the sound waves and prevents irritation of your vagina. The technician will insert the transducer into your vagina to get images. These will be displayed  on a monitor that looks like a small television screen. The transducer will be removed when the procedure is complete. The procedure may vary among health care providers and hospitals. What happens after the procedure? It is up to you to get the results of your procedure. Ask your health care provider, or the department that is doing the procedure, when your results will be ready. Keep all follow-up visits as told by your health care provider. This is important. Summary A transvaginal ultrasound, also called an endovaginal ultrasound, is a test that uses sound waves to take pictures of the female genital tract. This is a safe procedure. There are no known risks associated with this test. The procedure needs to be done when your bladder is empty. Follow your health care provider's instructions about drinking fluids and emptying your bladder before the test. During the procedure, you will undress from the waist down and lie down on an exam table. A technician will insert a transducer into your vagina to obtain images. Ask your health care provider, or the department that is doing the procedure, when your results will be ready. This information is not intended to replace advice given to you by your health care provider. Make sure you discuss any questions you have with your healthcare provider. Document Revised: 07/09/2020 Document Reviewed: 03/09/2018 Elsevier Patient Education  2022 [employer].

## 2021-02-17 LAB — CYTOLOGY - PAP
Adequacy: ABSENT
Comment: NEGATIVE
Diagnosis: NEGATIVE
High risk HPV: NEGATIVE

## 2021-03-03 ENCOUNTER — Other Ambulatory Visit: Payer: Self-pay

## 2021-03-03 ENCOUNTER — Ambulatory Visit (HOSPITAL_BASED_OUTPATIENT_CLINIC_OR_DEPARTMENT_OTHER)
Admission: RE | Admit: 2021-03-03 | Discharge: 2021-03-03 | Disposition: A | Discharge status: Home / Self Care | Payer: BC Managed Care – PPO | Source: Ambulatory Visit | Attending: Obstetrics & Gynecology | Admitting: Obstetrics & Gynecology

## 2021-03-03 DIAGNOSIS — R1032 Left lower quadrant pain: Secondary | ICD-10-CM | POA: Diagnosis not present

## 2021-03-06 ENCOUNTER — Ambulatory Visit: Payer: BC Managed Care – PPO

## 2021-03-10 ENCOUNTER — Other Ambulatory Visit: Payer: Self-pay

## 2021-03-10 ENCOUNTER — Encounter: Payer: Self-pay | Admitting: Obstetrics & Gynecology

## 2021-03-10 ENCOUNTER — Ambulatory Visit: Payer: BC Managed Care – PPO | Admitting: Obstetrics & Gynecology

## 2021-03-10 VITALS — BP 130/80 | Ht 65.0 in | Wt 278.0 lb

## 2021-03-10 DIAGNOSIS — R1032 Left lower quadrant pain: Secondary | ICD-10-CM

## 2021-03-10 DIAGNOSIS — K660 Peritoneal adhesions (postprocedural) (postinfection): Secondary | ICD-10-CM | POA: Diagnosis not present

## 2021-03-10 NOTE — Progress Notes (Signed)
  HPI: Abdominal Pain Patient presents for evaluation of abdominal pain. The pain is described as cramping, sharp, shooting, and stabbing, and is 8/10 in intensity. Pain is located in the LLQ area without radiation. Onset was ongoing occurring 5 months ago. Symptoms have been unchanged since. Aggravating factors: activity and menses. Alleviating factors: none. Associated symptoms: none. The patient denies chills, constipation, diarrhea, fever, nausea, and vomiting. Risk factors for pelvic/abdominal pain include  surgery for ectopic in Feb 2022; was told she has adhesions around omentum and bowel .  Ultrasound demonstrates no masses seen, no cyst, no free fluid These findings are Pelvis  gyn normal  PMHx: She  has a past medical history of Hypertension. Also,  has a past surgical history that includes No past surgeries; Dilation and curettage of uterus (N/A, 12/17/2017); and Diagnostic laparoscopy with removal of ectopic pregnancy (Right, 10/03/2020)., family history is not on file.,  reports that she has never smoked. She has never used smokeless tobacco. She reports that she does not drink alcohol and does not use drugs.  She has a current medication list which includes the following prescription(s): ferrous sulfate, lansoprazole, ondansetron, and phentermine. Also, has No Known Allergies.  Review of Systems  Gastrointestinal:  Positive for abdominal pain.  All other systems reviewed and are negative.  Objective: BP 130/80   Ht 5\' 5"  (1.651 m)   Wt 278 lb (126.1 kg)   LMP 02/13/2021   BMI 46.26 kg/m   Physical examination Constitutional NAD, Conversant  Skin No rashes, lesions or ulceration.   Extremities: Moves all appropriately.  Normal ROM for age. No lymphadenopathy.  Neuro: Grossly intact  Psych: Oriented to PPT.  Normal mood. Normal affect.   Assessment:  LLQ pain Adhesion of omentum/bowel  - Plan: Ambulatory referral to General Surgery  Discussed holding off on pregnancy  tries until all investigation for pain is complete, as may need imaging or surgery. Also advised for weight loss prior to pregnancy try as will help lessen risk of pregnancy. Risk of ectopic in future pregnancies discussed.  A total of 20 minutes were spent face-to-face with the patient as well as preparation, review, communication, and documentation during this encounter.   04/16/2021, MD, Annamarie Major Ob/Gyn, [facility] 03/10/2021  9:36 AM

## 2021-03-17 DIAGNOSIS — Z13220 Encounter for screening for lipoid disorders: Secondary | ICD-10-CM | POA: Diagnosis not present

## 2021-03-17 DIAGNOSIS — R062 Wheezing: Secondary | ICD-10-CM | POA: Diagnosis not present

## 2021-03-17 DIAGNOSIS — Z131 Encounter for screening for diabetes mellitus: Secondary | ICD-10-CM | POA: Diagnosis not present

## 2021-03-17 DIAGNOSIS — I10 Essential (primary) hypertension: Secondary | ICD-10-CM | POA: Diagnosis not present

## 2021-04-01 ENCOUNTER — Other Ambulatory Visit: Payer: Self-pay | Admitting: General Surgery

## 2021-04-01 DIAGNOSIS — R1032 Left lower quadrant pain: Secondary | ICD-10-CM

## 2021-04-01 DIAGNOSIS — R194 Change in bowel habit: Secondary | ICD-10-CM | POA: Diagnosis not present

## 2021-04-08 DIAGNOSIS — I8393 Asymptomatic varicose veins of bilateral lower extremities: Secondary | ICD-10-CM | POA: Diagnosis not present

## 2021-04-18 ENCOUNTER — Other Ambulatory Visit: Payer: Self-pay

## 2021-04-18 ENCOUNTER — Ambulatory Visit
Admission: RE | Admit: 2021-04-18 | Discharge: 2021-04-18 | Disposition: A | Discharge status: Home / Self Care | Payer: BC Managed Care – PPO | Source: Ambulatory Visit | Attending: General Surgery | Admitting: General Surgery

## 2021-04-18 DIAGNOSIS — R1032 Left lower quadrant pain: Secondary | ICD-10-CM | POA: Insufficient documentation

## 2021-04-18 DIAGNOSIS — R109 Unspecified abdominal pain: Secondary | ICD-10-CM | POA: Diagnosis not present

## 2021-04-18 MED ORDER — IOHEXOL 300 MG/ML  SOLN
100.0000 mL | Freq: Once | INTRAMUSCULAR | Status: AC | PRN
  Administered 2021-04-18: 100 mL via INTRAVENOUS

## 2021-04-22 ENCOUNTER — Other Ambulatory Visit: Payer: Self-pay | Admitting: General Surgery

## 2021-04-22 NOTE — Progress Notes (Signed)
Subjective:     Patient ID: Natasha Carter is a 30 y.o. female.   HPI   The following portions of the patient's history were reviewed and updated as appropriate.   This a new patient is here today for: office visit. The patient has been referred by Dr. Tiburcio Pea for evaluation of left lower quadrant abdominal pain. The patient states this has been present since her surgery for her ectopic pregnancy done in February 2022. Patient reports the abdominal pain is all day every day. She states it is worse when she is ovulating for 3-4 days) or when she is on her period were symptoms last about 7 days). Patient reports she has bowel movements 2-3 times per day.  This does not influence nor relieve her abdominal discomfort.  She describes the stools from watery to pudding texture.  This has been since her laparoscopy in February of this year and developed about the same time, 1-2 weeks after the procedure that the abdominal pain developed.  She does reports mucus in her stools but denies rectal bleeding.    Patient did have a CT abdomen/pelvis done in February, prior to her laparoscopic exam.  Prior to this study in office ultrasound after she reported 3 at home urine pregnancy test were positive failed to identify an intrauterine pregnancy.  10 days after initial presentation the beta hCG had risen to 11,700.   The patient was seen on October 03, 2020 when she had developed right lower quadrant pain as well as vaginal bleeding.   The patient was taken to the operating room on October 03, 2020 was suspicion for an ectopic pregnancy.  This was confirmed and the right tube described in the operative report in the central portion of the isthmus.  A salpingostomy was attempted but bleeding was enough to warrant salpingectomy.   She underwent a CT of the abdomen and pelvis 12 days after initial symptoms/presentation, and 2 days after her laparoscopic procedure on October 05, 2020.  This was when she presented to the  emergency room with postoperative pain and vaginal bleeding.  This was a contrast-enhanced study showing a 2.8 cm fluid density thought to represent a dominant follicle.  No acute intra-abdominal or intrapelvic pathology was identified.  No intrauterine pregnancy was seen.  A right adnexal mass with a maximum diameter of 2.5 cm was seen and a complex right ovarian cyst measuring 3.1 cm.  CBC at that time showed a fall in her hemoglobin from 15 the day of surgery and office to 13.5.  White blood cell count was normal.  Platelet count was normal.   The patient was seen at the [facility] facility in [place] on October 21, 2020 and was discharged with a diagnosis of viral pneumonia.  She had presented with a history of worsening chest pain, cough, shortness of breath and wheezing.  CTA at that time was negative for pulmonary embolus but the report describes consolidative and groundglass opacities throughout both lungs concerning for viral pneumonia.  Short-term interval follow-up in 3-6 weeks was recommended.  Hemoglobin determine during this visit was stable at 13.8 and the patient was not noted to have an elevated white blood cell count.  hCG determination was normal at 5.5.  Cardiac ultrasound was normal.   No medical interventions between March 14 and a "well woman exam on February 14, 2021 are recorded.   She also had a transvaginal pelvic ultrasound done 03-03-21 at the office of Annamarie Major, MD in part to assess her  left lower quadrant pain but she reports it was negative.    No change in weight between March 2022 and the present.    The patient is married, husband manages the regional [employer].  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        Chief Complaint  Patient presents with   Abdominal Pain      BP 132/84   Pulse 87   Temp 36.7 C (98.1 F)   Ht 165.1 cm (5\' 5" )   Wt (!) 123.4 kg (272 lb)   LMP 03/12/2021 (Exact Date)   SpO2 97%   BMI 45.26  kg/m        Past Medical History:  Diagnosis Date   Hypertension             Past Surgical History:  Procedure Laterality Date   DILATION AND CURETTAGE OF UTERUS   12/2017   LAPAROSCOPIC SALPINGECTOMY Right 09/2020    Ectopic pregnancy                OB History     Gravida  1   Para  0   Term  0   Preterm  0   AB  1   Living  0      SAB  0   IAB  0   Ectopic  1   Molar  0   Multiple  0   Live Births  0        Obstetric Comments  Age at first period 72 Age of first pregnancy 43 (Ectopic pregnancy)             Social History           Socioeconomic History   Marital status: Married  Occupational History   Occupation: Stay at home mom  Tobacco Use   Smoking status: Never Smoker   Smokeless tobacco: Never Used  32 Use: Never used  Substance and Sexual Activity   Alcohol use: Not Currently   Drug use: Never   Sexual activity: Yes      Partners: Male      Birth control/protection: None      Comment: Trying to concieve        No Known Allergies   Current Medications        Current Outpatient Medications  Medication Sig Dispense Refill   acetaminophen (TYLENOL) 500 MG tablet Take 1,000 mg by mouth       ibuprofen (MOTRIN) 600 MG tablet         hydroCHLOROthiazide (HYDRODIURIL) 25 MG tablet  (Patient not taking: No sig reported)   0   medroxyPROGESTERone (PROVERA) 10 MG tablet Provera 10 mg  1 tablet daily x 12 days every 3 months (Patient not taking: No sig reported) 12 tablet 11   meloxicam (MOBIC) 7.5 MG tablet Take 1 tablet (7.5 mg total) by mouth once daily (Patient not taking: Reported on 04/01/2021) 30 tablet 0   ondansetron (ZOFRAN) 4 MG tablet Take 1 tablet (4 mg total) by mouth every 8 (eight) hours as needed for Nausea (Patient not taking: Reported on 04/01/2021) 30 tablet 0   prenatal vitamin with iron-folic acid (PRENATAL TABLETS) tablet Take 1 tablet by mouth once daily (Patient not taking: No sig reported)         No current facility-administered medications for this visit.             Family  History  Problem Relation Age of Onset   Lung cancer Paternal Grandmother     Breast cancer Neg Hx     Colon cancer Neg Hx          Review of Systems  Constitutional: Negative for chills and fever.  Respiratory: Negative for cough.          Objective:   Physical Exam Exam conducted with a chaperone present.  Constitutional:      Appearance: Normal appearance.  Cardiovascular:     Rate and Rhythm: Normal rate and regular rhythm.     Pulses: Normal pulses.     Heart sounds: Normal heart sounds.  Pulmonary:     Effort: Pulmonary effort is normal.     Breath sounds: Normal breath sounds.  Abdominal:     General: Abdomen is protuberant.     Palpations: Abdomen is soft.     Tenderness: There is no abdominal tenderness.     Hernia: No hernia is present.       Comments: Port sites without focal tenderness, inflammation or mass-effect.  Area of constant pain in the left lateral abdominal wall medial to the linea alba, inferior to the umbilicus.  No focal tenderness to palpation.  Exam completed in the supine and standing position.  Musculoskeletal:     Cervical back: Neck supple.  Skin:    General: Skin is warm and dry.  Neurological:     Mental Status: She is alert and oriented to person, place, and time.  Psychiatric:        Mood and Affect: Mood normal.        Behavior: Behavior normal.        Labs and Radiology:    Office notes of February 14, 2021 from Annamarie Major, MD and associated pelvic ultrasound were reviewed.   Operative report was reviewed in detail, and laboratory studies from February 2022 through the present as summarized in the history of present illness.   The operative report describes initial abdominal access with a 5 mm transumbilical Optiview port.  An 11 mm port was placed in the left lateral abdominal wall and a 5 mm port on the right.     Assessment:     2  issues: 1) constant left lower third rectus sheath pain away from any of the obvious port sites, worse during ovulation and menses and 2) change in bowel habits.   No further rectal bleeding.    Plan:     The patient has no family history of GI or ovarian pathology.  The possibility of intra-abdominal adhesions is unlikely considering the onset of symptoms began 1-2 weeks of her surgery.  Whether she could have a tab of an appendices epiploica or omentum caught in one of the port sites producing constant abdominal discomfort with or without associated endometriosis (not described during her February exploration) is possible.   Primary bowel pathology with the history of mucus is present, without ongoing rectal bleeding unlikely this is a infectious colitis.   History of rectal bleeding in March, warrants investigation if CT is normal.   Of these 2 studies are inconclusive, repeat last Prostabrit, possibly in combination with the GYN service due to her worsening of symptoms during ovulation and menses would be appropriate.  Our office will make arrangements for a CT scan at a convenient date.     This note is partially prepared by Wendall Stade, CMA acting as a scribe in the presence of Dr. Donnalee Curry, MD.  The documentation recorded by the scribe accurately reflects the service I personally performed and the decisions made by me.  Over 1 hour was spent with the patient, the majority of which was with record review and counseling.   This will likely require a two-step process 1) repeat imaging to assess for any obvious defect and 2) colonoscopy.  Although the latter is thought to be of low likelihood for a pathologic cancer, with her  Earline Mayotte, MD FACS   CT of the abdomen and pelvis of April 18, 2021:  IMPRESSION: Diverticulosis of descending and sigmoid colon without inflammation.   No acute abnormality seen in the abdomen or pelvis.   Will proceed with colonoscopy  to assess her source of rectal bleeding.    JWB  04/22/2021

## 2021-04-29 ENCOUNTER — Encounter: Payer: Self-pay | Admitting: General Surgery

## 2021-04-30 ENCOUNTER — Ambulatory Visit: Payer: BC Managed Care – PPO | Admitting: Anesthesiology

## 2021-04-30 ENCOUNTER — Ambulatory Visit
Admission: RE | Admit: 2021-04-30 | Discharge: 2021-04-30 | Disposition: A | Discharge status: Home / Self Care | Payer: BC Managed Care – PPO | Attending: General Surgery | Admitting: General Surgery

## 2021-04-30 ENCOUNTER — Encounter
Admission: RE | Disposition: A | Discharge status: Home / Self Care | Payer: Self-pay | Source: Home / Self Care | Attending: General Surgery

## 2021-04-30 ENCOUNTER — Encounter: Payer: Self-pay | Admitting: General Surgery

## 2021-04-30 DIAGNOSIS — R1032 Left lower quadrant pain: Secondary | ICD-10-CM | POA: Insufficient documentation

## 2021-04-30 DIAGNOSIS — Z79899 Other long term (current) drug therapy: Secondary | ICD-10-CM | POA: Diagnosis not present

## 2021-04-30 DIAGNOSIS — R194 Change in bowel habit: Secondary | ICD-10-CM | POA: Diagnosis not present

## 2021-04-30 DIAGNOSIS — K625 Hemorrhage of anus and rectum: Secondary | ICD-10-CM | POA: Diagnosis not present

## 2021-04-30 DIAGNOSIS — I10 Essential (primary) hypertension: Secondary | ICD-10-CM | POA: Diagnosis not present

## 2021-04-30 HISTORY — PX: COLONOSCOPY WITH PROPOFOL: SHX5780

## 2021-04-30 LAB — POCT PREGNANCY, URINE: Preg Test, Ur: NEGATIVE

## 2021-04-30 SURGERY — COLONOSCOPY WITH PROPOFOL
Anesthesia: General

## 2021-04-30 MED ORDER — DEXMEDETOMIDINE (PRECEDEX) IN NS 20 MCG/5ML (4 MCG/ML) IV SYRINGE
PREFILLED_SYRINGE | INTRAVENOUS | Status: DC | PRN
  Administered 2021-04-30: 12 ug via INTRAVENOUS

## 2021-04-30 MED ORDER — PROPOFOL 500 MG/50ML IV EMUL
INTRAVENOUS | Status: DC | PRN
  Administered 2021-04-30: 140 ug/kg/min via INTRAVENOUS

## 2021-04-30 MED ORDER — SODIUM CHLORIDE 0.9 % IV SOLN
INTRAVENOUS | Status: DC
  Administered 2021-04-30: 1000 mL via INTRAVENOUS

## 2021-04-30 MED ORDER — PROPOFOL 10 MG/ML IV BOLUS
INTRAVENOUS | Status: DC | PRN
  Administered 2021-04-30: 70 mg via INTRAVENOUS

## 2021-04-30 MED ORDER — LIDOCAINE HCL (CARDIAC) PF 100 MG/5ML IV SOSY
PREFILLED_SYRINGE | INTRAVENOUS | Status: DC | PRN
Start: 2021-04-30 — End: 2021-04-30
  Administered 2021-04-30: 100 mg via INTRAVENOUS

## 2021-04-30 NOTE — Anesthesia Preprocedure Evaluation (Signed)
Anesthesia Evaluation  Patient identified by MRN, date of birth, ID band Patient awake    Reviewed: Allergy & Precautions, NPO status , Patient's Chart, lab work & pertinent test results  History of Anesthesia Complications Negative for: history of anesthetic complications  Airway Mallampati: I  TM Distance: >3 FB Neck ROM: Full    Dental no notable dental hx. (+) Dental Advidsory Given, Teeth Intact   Pulmonary neg pulmonary ROS, neg shortness of breath, neg sleep apnea, neg COPD, neg recent URI,    breath sounds clear to auscultation- rhonchi (-) wheezing      Cardiovascular Exercise Tolerance: Good hypertension, (-) angina(-) CAD, (-) Past MI, (-) Cardiac Stents and (-) CABG negative cardio ROS   Rhythm:Regular Rate:Normal - Systolic murmurs and - Diastolic murmurs    Neuro/Psych negative psych ROS   GI/Hepatic Neg liver ROS, GERD  ,  Endo/Other  neg diabetesMorbid obesity  Renal/GU negative Renal ROS     Musculoskeletal negative musculoskeletal ROS (+)   Abdominal (+) + obese,   Peds  Hematology negative hematology ROS (+)   Anesthesia Other Findings Past Medical History: No date: Hypertension Rectal Bleeding  Reproductive/Obstetrics                            Anesthesia Physical  Anesthesia Plan  ASA: III  Anesthesia Plan: General   Post-op Pain Management:    Induction: Intravenous  PONV Risk Score and Plan: 2 and Treatment may vary due to age or medical condition and Propofol infusion  Airway Management Planned: Nasal Cannula and Natural Airway  Additional Equipment:   Intra-op Plan:   Post-operative Plan:   Informed Consent: I have reviewed the patients History and Physical, chart, labs and discussed the procedure including the risks, benefits and alternatives for the proposed anesthesia with the patient or authorized representative who has indicated his/her  understanding and acceptance.     Dental advisory given  Plan Discussed with: CRNA and Anesthesiologist  Anesthesia Plan Comments:        Anesthesia Quick Evaluation

## 2021-04-30 NOTE — H&P (Signed)
Sarenity Ramaker [id number] 1991-07-16     HPI:  30 y/o with LLQ pain. Negative CT and transvaginal ultrasound for pathology. For colonoscopy prior to possible diagnostic laparoscopy.   Medications Prior to Admission  Medication Sig Dispense Refill Last Dose   acetaminophen (TYLENOL) 500 MG tablet Take 1,000 mg by mouth every 6 (six) hours as needed.   Past Week   ondansetron (ZOFRAN) 8 MG tablet Take 8 mg by mouth every 4 (four) hours as needed for nausea or vomiting.   Past Week   phentermine 37.5 MG capsule Take 37.5 mg by mouth daily before breakfast.   Past Week   Ferrous Sulfate 140 (45 Fe) MG TBCR Take 140 mg by mouth daily.      ibuprofen (ADVIL) 600 MG tablet Take 600 mg by mouth every 6 (six) hours as needed. (Patient not taking: Reported on 04/30/2021)   Not Taking   lansoprazole (PREVACID) 15 MG capsule Take 15 mg by mouth daily at 12 noon. (Patient not taking: Reported on 04/30/2021)   Not Taking   No Known Allergies Past Medical History:  Diagnosis Date   Hypertension    Past Surgical History:  Procedure Laterality Date   DIAGNOSTIC LAPAROSCOPY     DIAGNOSTIC LAPAROSCOPY WITH REMOVAL OF ECTOPIC PREGNANCY Right 10/03/2020   Procedure: LAPAROSCOPIC RIGHT SALPINGECTOMY WITH REMOVAL OF ECTOPIC PREGNANCY;  Surgeon: Ward, Elenora Fender, MD;  Location: [facility] ORS;  Service: Gynecology;  Laterality: Right;   DILATION AND CURETTAGE OF UTERUS N/A 12/17/2017   Procedure: DILATATION AND CURETTAGE;  Surgeon: Schermerhorn, Ihor Austin, MD;  Location: [facility] ORS;  Service: Gynecology;  Laterality: N/A;   NO PAST SURGERIES     Social History   Socioeconomic History   Marital status: Married    Spouse name: Not on file   Number of children: Not on file   Years of education: Not on file   Highest education level: Not on file  Occupational History   Not on file  Tobacco Use   Smoking status: Never   Smokeless tobacco: Never  Vaping Use   Vaping Use: Never used  Substance and Sexual Activity    Alcohol use: No   Drug use: No   Sexual activity: Not Currently    Birth control/protection: None  Other Topics Concern   Not on file  Social History Narrative   Not on file   Social Determinants of Health   Financial Resource Strain: Not on file  Food Insecurity: Not on file  Transportation Needs: Not on file  Physical Activity: Not on file  Stress: Not on file  Social Connections: Not on file  Intimate Partner Violence: Not on file   Social History   Social History Narrative   Not on file     ROS: Negative.     PE: HEENT: Negative. Lungs: Clear. Cardio: RR.   Assessment/Plan:  Proceed with planned endoscopy.   Merrily [facility] 04/30/2021

## 2021-04-30 NOTE — Op Note (Signed)
[facility] Patient Name: Natasha Carter Procedure Date: 04/30/2021 10:29 AM MRN: [id number] Account #: [id number] Date of Birth: 04-Sep-1990 Admit Type: Outpatient Age: 30 Room: [facility] ENDO ROOM 1 Gender: Female Note Status: Finalized Instrument Name: Peds Colonoscope [id number] Procedure:             Colonoscopy Indications:           Change in bowel habits Providers:             Earline Mayotte, MD Referring MD:          [facility], MD (Referring MD) Medicines:             Propofol per Anesthesia Complications:         No immediate complications. Procedure:             Pre-Anesthesia Assessment:                        - Prior to the procedure, a History and Physical was                         performed, and patient medications, allergies and                         sensitivities were reviewed. The patient's tolerance                         of previous anesthesia was reviewed.                        - The risks and benefits of the procedure and the                         sedation options and risks were discussed with the                         patient. All questions were answered and informed                         consent was obtained.                        After obtaining informed consent, the colonoscope was                         passed under direct vision. Throughout the procedure,                         the patient's blood pressure, pulse, and oxygen                         saturations were monitored continuously. The                         Colonoscope was introduced through the anus and                         advanced to the the terminal ileum. The colonoscopy  was performed without difficulty. The patient                         tolerated the procedure well. The quality of the bowel                         preparation was excellent. Findings:      The entire examined colon appeared  normal on direct and retroflexion       views. Impression:            - The entire examined colon is normal on direct and                         retroflexion views.                        - No specimens collected. Recommendation:        - Return to endoscopist in 2 weeks. Procedure Code(s):     --- Professional ---                        [phone], Colonoscopy, flexible; diagnostic, including                         collection of specimen(s) by brushing or washing, when                         performed (separate procedure) Diagnosis Code(s):     --- Professional ---                        R19.4, Change in bowel habit CPT copyright 2019 American [employer]. All rights reserved. The codes documented in this report are preliminary and upon coder review may  be revised to meet current compliance requirements. Earline Mayotte, MD 04/30/2021 10:56:49 AM This report has been signed electronically. Number of Addenda: 0 Note Initiated On: 04/30/2021 10:29 AM Scope Withdrawal Time: 0 hours 10 minutes 48 seconds  Total Procedure Duration: 0 hours 14 minutes 15 seconds  Estimated Blood Loss:  Estimated blood loss: none.      [facility]

## 2021-04-30 NOTE — Transfer of Care (Signed)
Immediate Anesthesia Transfer of Care Note  Patient: Natasha Carter  Procedure(s) Performed: COLONOSCOPY WITH PROPOFOL  Patient Location: PACU  Anesthesia Type:General  Level of Consciousness: awake, alert  and oriented  Airway & Oxygen Therapy: Patient Spontanous Breathing  Post-op Assessment: Report given to RN and Post -op Vital signs reviewed and stable  Post vital signs: Reviewed and stable  Last Vitals:  Vitals Value Taken Time  BP 116/72 04/30/21 1100  Temp 36.7 C 04/30/21 1059  Pulse 71 04/30/21 1101  Resp 10 04/30/21 1101  SpO2 100 % 04/30/21 1101  Vitals shown include unvalidated device data.  Last Pain:  Vitals:   04/30/21 1059  TempSrc: Temporal  PainSc: Asleep         Complications: No notable events documented.

## 2021-05-01 ENCOUNTER — Encounter: Payer: Self-pay | Admitting: General Surgery

## 2021-05-02 NOTE — Anesthesia Postprocedure Evaluation (Signed)
Anesthesia Post Note  Patient: Natasha Carter  Procedure(s) Performed: COLONOSCOPY WITH PROPOFOL  Patient location during evaluation: Endoscopy Anesthesia Type: General Level of consciousness: awake and alert Pain management: pain level controlled Vital Signs Assessment: post-procedure vital signs reviewed and stable Respiratory status: spontaneous breathing, nonlabored ventilation and respiratory function stable Cardiovascular status: blood pressure returned to baseline and stable Postop Assessment: no apparent nausea or vomiting Anesthetic complications: no   No notable events documented.   Last Vitals:  Vitals:   04/30/21 1119 04/30/21 1129  BP: 116/71 121/82  Pulse: 71 100  Resp: 20 16  Temp:    SpO2: 100% 100%    Last Pain:  Vitals:   05/01/21 0737  TempSrc:   PainSc: 0-No pain                 Foye Deer

## 2021-05-16 ENCOUNTER — Other Ambulatory Visit (INDEPENDENT_AMBULATORY_CARE_PROVIDER_SITE_OTHER): Payer: Self-pay | Admitting: Nurse Practitioner

## 2021-05-16 DIAGNOSIS — I83811 Varicose veins of right lower extremities with pain: Secondary | ICD-10-CM

## 2021-05-19 ENCOUNTER — Ambulatory Visit (INDEPENDENT_AMBULATORY_CARE_PROVIDER_SITE_OTHER): Payer: BC Managed Care – PPO

## 2021-05-19 ENCOUNTER — Other Ambulatory Visit: Payer: Self-pay

## 2021-05-19 ENCOUNTER — Ambulatory Visit (INDEPENDENT_AMBULATORY_CARE_PROVIDER_SITE_OTHER): Payer: BC Managed Care – PPO | Admitting: Nurse Practitioner

## 2021-05-19 VITALS — BP 139/80 | HR 73 | Ht 65.0 in | Wt 277.0 lb

## 2021-05-19 DIAGNOSIS — I83813 Varicose veins of bilateral lower extremities with pain: Secondary | ICD-10-CM | POA: Diagnosis not present

## 2021-05-19 DIAGNOSIS — I83811 Varicose veins of right lower extremities with pain: Secondary | ICD-10-CM

## 2021-05-19 DIAGNOSIS — I83893 Varicose veins of bilateral lower extremities with other complications: Secondary | ICD-10-CM

## 2021-05-19 DIAGNOSIS — R200 Anesthesia of skin: Secondary | ICD-10-CM | POA: Diagnosis not present

## 2021-05-19 DIAGNOSIS — R202 Paresthesia of skin: Secondary | ICD-10-CM

## 2021-05-22 DIAGNOSIS — R194 Change in bowel habit: Secondary | ICD-10-CM | POA: Diagnosis not present

## 2021-05-22 DIAGNOSIS — R1032 Left lower quadrant pain: Secondary | ICD-10-CM | POA: Diagnosis not present

## 2021-05-25 ENCOUNTER — Encounter (INDEPENDENT_AMBULATORY_CARE_PROVIDER_SITE_OTHER): Payer: Self-pay | Admitting: Nurse Practitioner

## 2021-05-25 NOTE — Progress Notes (Signed)
Subjective:    Patient ID: Natasha Carter, female    DOB: April 03, 1991, 30 y.o.   MRN: [id number] Chief Complaint  Patient presents with   New Patient (Initial Visit)    Np Jesse Sans RLE VV with discomfort LE ven reflux     Natasha Carter is a 30 year old female that is is seen for evaluation of symptomatic varicose veins that are predominantly in her right lower extremity. The patient relates burning and stinging which worsened steadily throughout the course of the day, particularly with standing.  These are very tender that is been ongoing for the last 2 months.  The patient also notes an aching and throbbing pain over the varicosities, particularly with prolonged dependent positions. The symptoms are significantly improved with elevation.  The patient also notes that during hot weather the symptoms are greatly intensified. The patient states the pain from the varicose veins interferes with work, daily exercise, shopping and household maintenance. At this point, the symptoms are persistent and severe enough that they're having a negative impact on lifestyle and are interfering with daily activities.  She also notes numbness and tingling in her lower extremities where she sits for an extended period of time.  There is no history of DVT, PE or superficial thrombophlebitis. There is no history of ulceration or hemorrhage. The patient denies a significant family history of varicose veins. OB history: G1P0010  The patient has not worn graduated compression in the past. At the present time the patient has not been using over-the-counter analgesics. There is no history of prior surgical intervention or sclerotherapy.  Today noninvasive studies show evidence of deep venous insufficiency.  There is also evidence of reflux in the right great saphenous vein at the saphenofemoral junction extending to the proximal calf.  The patient also has evidence of reflux and a large anterior accessory saphenous vein that  extends from the saphenofemoral junction tortuously to the knee Which tends to be the most painful area for the patient.  There is no DVT or superficial thrombophlebitis seen in the right lower extremity.     Review of Systems  Cardiovascular:  Positive for leg swelling.  Neurological:  Positive for numbness.       Tenderness  All other systems reviewed and are negative.     Objective:   Physical Exam Vitals reviewed.  HENT:     Head: Normocephalic.  Cardiovascular:     Rate and Rhythm: Normal rate.     Pulses: Normal pulses.  Pulmonary:     Effort: Pulmonary effort is normal.  Musculoskeletal:        General: Tenderness present.  Skin:    General: Skin is warm and dry.  Neurological:     Mental Status: She is alert and oriented to person, place, and time.  Psychiatric:        Mood and Affect: Mood normal.        Behavior: Behavior normal.        Thought Content: Thought content normal.        Judgment: Judgment normal.    BP 139/80   Pulse 73   Ht 5\' 5"  (1.651 m)   Wt 277 lb (125.6 kg)   BMI 46.10 kg/m   Past Medical History:  Diagnosis Date   Hypertension     Social History   Socioeconomic History   Marital status: Married    Spouse name: Not on file   Number of children: Not on file   Years of education: Not  on file   Highest education level: Not on file  Occupational History   Not on file  Tobacco Use   Smoking status: Never   Smokeless tobacco: Never  Vaping Use   Vaping Use: Never used  Substance and Sexual Activity   Alcohol use: No   Drug use: No   Sexual activity: Not Currently    Birth control/protection: None  Other Topics Concern   Not on file  Social History Narrative   Not on file   Social Determinants of Health   Financial Resource Strain: Not on file  Food Insecurity: Not on file  Transportation Needs: Not on file  Physical Activity: Not on file  Stress: Not on file  Social Connections: Not on file  Intimate Partner  Violence: Not on file    Past Surgical History:  Procedure Laterality Date   COLONOSCOPY WITH PROPOFOL N/A 04/30/2021   Procedure: COLONOSCOPY WITH PROPOFOL;  Surgeon: Earline Mayotte, MD;  Location: [facility] ENDOSCOPY;  Service: Endoscopy;  Laterality: N/A;   DIAGNOSTIC LAPAROSCOPY     DIAGNOSTIC LAPAROSCOPY WITH REMOVAL OF ECTOPIC PREGNANCY Right 10/03/2020   Procedure: LAPAROSCOPIC RIGHT SALPINGECTOMY WITH REMOVAL OF ECTOPIC PREGNANCY;  Surgeon: Ward, Elenora Fender, MD;  Location: [facility] ORS;  Service: Gynecology;  Laterality: Right;   DILATION AND CURETTAGE OF UTERUS N/A 12/17/2017   Procedure: DILATATION AND CURETTAGE;  Surgeon: Schermerhorn, Ihor Austin, MD;  Location: [facility] ORS;  Service: Gynecology;  Laterality: N/A;   NO PAST SURGERIES      History reviewed. No pertinent family history.  No Known Allergies  CBC Latest Ref Rng & Units 10/05/2020 12/17/2017 09/15/2017  WBC 4.0 - 10.5 K/uL 9.0 6.6 6.4  Hemoglobin 12.0 - 15.0 g/dL 16.1 09.6 04.5  Hematocrit 36.0 - 46.0 % 40.8 40.0 40.5  Platelets 150 - 400 K/uL 237 249 244      CMP     Component Value Date/Time   NA 139 10/05/2020 1350   K 3.7 10/05/2020 1350   CL 107 10/05/2020 1350   CO2 25 10/05/2020 1350   GLUCOSE 98 10/05/2020 1350   BUN 11 10/05/2020 1350   CREATININE 0.77 10/05/2020 1350   CALCIUM 8.7 (L) 10/05/2020 1350   PROT 7.0 10/05/2020 1350   ALBUMIN 3.9 10/05/2020 1350   AST 23 10/05/2020 1350   ALT 18 10/05/2020 1350   ALKPHOS 33 (L) 10/05/2020 1350   BILITOT 0.5 10/05/2020 1350   GFRNONAA >60 10/05/2020 1350   GFRAA >60 12/17/2017 0716     No results found.     Assessment & Plan:     1. Varicose veins of both lower extremities with pain Recommend:  The patient has large symptomatic varicose veins that are painful and associated with swelling.  I have had a long discussion with the patient regarding  varicose veins and why they cause symptoms.  Patient will begin wearing graduated compression  stockings class 1 on a daily basis, beginning first thing in the morning and removing them in the evening. The patient is instructed specifically not to sleep in the stockings.    The patient  will also begin using over-the-counter analgesics such as Motrin 600 mg po TID to help control the symptoms.    In addition, behavioral modification including elevation during the day will be initiated.    Pending the results of these changes the  patient will be reevaluated in three months.   Patient has had reflux in his right lower extremity but she does have notable  varicosities and tenderness in her left as well.  In 3 months we will also perform a left lower extremity venous reflux.  Further plans will be based on the ultrasound results and whether conservative therapies are successful at eliminating the pain and swelling.  - VAS [place] LOWER EXTREMITY VENOUS REFLUX; Future  2. Numbness and tingling of both feet The patient has evidence of extensive reflux in her right lower extremity typically this is not consistent for causing numbness and tingling in her feet.  We will perform arterial studies to see if there is an arterial cause of her numbness and tingling.  We also discussed other possible causes such as lower back issues.  If her ABIs are normal patient will proceed to follow-up with her PCP for evaluation of the because of numbness and tingling. - VAS [place] ABI WITH/WO TBI; Future  Current Outpatient Medications on File Prior to Visit  Medication Sig Dispense Refill   acetaminophen (TYLENOL) 500 MG tablet Take 1,000 mg by mouth every 6 (six) hours as needed.     Ferrous Sulfate 140 (45 Fe) MG TBCR Take 140 mg by mouth daily.     ibuprofen (ADVIL) 600 MG tablet Take 600 mg by mouth every 6 (six) hours as needed.     lansoprazole (PREVACID) 15 MG capsule Take 15 mg by mouth daily at 12 noon.     ondansetron (ZOFRAN) 8 MG tablet Take 8 mg by mouth every 4 (four) hours as needed for nausea or vomiting.      phentermine 37.5 MG capsule Take 37.5 mg by mouth daily before breakfast.     PROAIR HFA 108 (90 Base) MCG/ACT inhaler Inhale into the lungs.     No current facility-administered medications on file prior to visit.    There are no Patient Instructions on file for this visit. Return in about 3 months (around 08/19/2021) for Varicose veins.   Georgiana Spinner, NP

## 2021-06-24 DIAGNOSIS — J309 Allergic rhinitis, unspecified: Secondary | ICD-10-CM | POA: Diagnosis not present

## 2021-06-24 DIAGNOSIS — Z299 Encounter for prophylactic measures, unspecified: Secondary | ICD-10-CM | POA: Diagnosis not present

## 2021-08-25 ENCOUNTER — Encounter (INDEPENDENT_AMBULATORY_CARE_PROVIDER_SITE_OTHER): Payer: Self-pay | Admitting: Nurse Practitioner

## 2021-08-25 ENCOUNTER — Ambulatory Visit (INDEPENDENT_AMBULATORY_CARE_PROVIDER_SITE_OTHER): Payer: BC Managed Care – PPO

## 2021-08-25 ENCOUNTER — Ambulatory Visit (INDEPENDENT_AMBULATORY_CARE_PROVIDER_SITE_OTHER): Payer: BC Managed Care – PPO | Admitting: Nurse Practitioner

## 2021-08-25 ENCOUNTER — Other Ambulatory Visit: Payer: Self-pay

## 2021-08-25 VITALS — BP 136/85 | HR 55 | Resp 14 | Ht 65.0 in | Wt 260.0 lb

## 2021-08-25 DIAGNOSIS — I83813 Varicose veins of bilateral lower extremities with pain: Secondary | ICD-10-CM

## 2021-08-25 DIAGNOSIS — R202 Paresthesia of skin: Secondary | ICD-10-CM | POA: Diagnosis not present

## 2021-08-25 DIAGNOSIS — R200 Anesthesia of skin: Secondary | ICD-10-CM

## 2021-09-02 ENCOUNTER — Encounter (INDEPENDENT_AMBULATORY_CARE_PROVIDER_SITE_OTHER): Payer: Self-pay | Admitting: Nurse Practitioner

## 2021-09-02 NOTE — Progress Notes (Signed)
Subjective:    Patient ID: Natasha Carter, female    DOB: 01/09/1991, 31 y.o.   MRN: [id number] Chief Complaint  Patient presents with   Follow-up    ultrasound    Natasha Carter is a 31 year old female that is is seen for evaluation of symptomatic varicose veins that are predominantly in her right lower extremity. The patient relates burning and stinging which worsened steadily throughout the course of the day, particularly with standing.  These are very tender that is been ongoing for the last 2 months.  The patient also notes an aching and throbbing pain over the varicosities, particularly with prolonged dependent positions.  Since her last office visit the patient has been utilizing medical grade compression and the discomfort has decreased.   She also notes numbness and tingling in her lower extremities where she sits for an extended period of time.   There is no history of DVT, PE or superficial thrombophlebitis. There is no history of ulceration or hemorrhage. The patient denies a significant family history of varicose veins. OB history: G1P0010    There is no history of prior surgical intervention or sclerotherapy. Previosly, noninvasive studies of the right lower extremity show evidence of deep venous insufficiency.  There is also evidence of reflux in the right great saphenous vein at the saphenofemoral junction extending to the proximal calf.  The patient also has evidence of reflux and a large anterior accessory saphenous vein that extends from the saphenofemoral junction tortuously to the knee Which tends to be the most painful area for the patient.  There is no DVT or superficial thrombophlebitis seen in the right lower extremity.  The patient has an ABI of 1.09 on the right and 1.20 on the left.  The patient has normal TBI's bilaterally.  She also has triphasic tibial artery waveforms bilaterally with good toe waveforms.  Today the left lower extremity shows no evidence of DVT or  superficial thrombophlebitis in the left lower extremity.  She does have evidence of deep venous insufficiency.  There is no reflux in her great saphenous vein however she has reflux and an anterior saphenous accessory vein which is tortuous.   Review of Systems  Cardiovascular:  Positive for leg swelling.  Neurological:  Positive for numbness.  All other systems reviewed and are negative.     Objective:   Physical Exam Vitals reviewed.  HENT:     Head: Normocephalic.  Cardiovascular:     Rate and Rhythm: Normal rate.     Pulses: Normal pulses.  Pulmonary:     Effort: Pulmonary effort is normal.  Skin:    General: Skin is warm and dry.     Comments: Large varicosities bilaterally  Neurological:     Mental Status: She is alert and oriented to person, place, and time.  Psychiatric:        Mood and Affect: Mood normal.        Behavior: Behavior normal.        Thought Content: Thought content normal.        Judgment: Judgment normal.    BP 136/85 (BP Location: Left Arm)    Pulse (!) 55    Resp 14    Ht 5\' 5"  (1.651 m)    Wt 260 lb (117.9 kg)    BMI 43.27 kg/m   Past Medical History:  Diagnosis Date   Hypertension     Social History   Socioeconomic History   Marital status: Married    Spouse  name: Not on file   Number of children: Not on file   Years of education: Not on file   Highest education level: Not on file  Occupational History   Not on file  Tobacco Use   Smoking status: Never   Smokeless tobacco: Never  Vaping Use   Vaping Use: Never used  Substance and Sexual Activity   Alcohol use: No   Drug use: No   Sexual activity: Not Currently    Birth control/protection: None  Other Topics Concern   Not on file  Social History Narrative   Not on file   Social Determinants of Health   Financial Resource Strain: Not on file  Food Insecurity: Not on file  Transportation Needs: Not on file  Physical Activity: Not on file  Stress: Not on file  Social  Connections: Not on file  Intimate Partner Violence: Not on file    Past Surgical History:  Procedure Laterality Date   COLONOSCOPY WITH PROPOFOL N/A 04/30/2021   Procedure: COLONOSCOPY WITH PROPOFOL;  Surgeon: Robert Bellow, MD;  Location: [facility];  Service: Endoscopy;  Laterality: N/A;   DIAGNOSTIC LAPAROSCOPY     DIAGNOSTIC LAPAROSCOPY WITH REMOVAL OF ECTOPIC PREGNANCY Right 10/03/2020   Procedure: LAPAROSCOPIC RIGHT SALPINGECTOMY WITH REMOVAL OF ECTOPIC PREGNANCY;  Surgeon: Ward, Honor Loh, MD;  Location: [facility];  Service: Gynecology;  Laterality: Right;   DILATION AND CURETTAGE OF UTERUS N/A 12/17/2017   Procedure: DILATATION AND CURETTAGE;  Surgeon: Schermerhorn, Gwen Her, MD;  Location: [facility];  Service: Gynecology;  Laterality: N/A;   NO PAST SURGERIES      History reviewed. No pertinent family history.  No Known Allergies  CBC Latest Ref Rng & Units 10/05/2020 12/17/2017 09/15/2017  WBC 4.0 - 10.5 K/uL 9.0 6.6 6.4  Hemoglobin 12.0 - 15.0 g/dL 13.5 13.9 14.0  Hematocrit 36.0 - 46.0 % 40.8 40.0 40.5  Platelets 150 - 400 K/uL 237 249 244      CMP     Component Value Date/Time   NA 139 10/05/2020 1350   K 3.7 10/05/2020 1350   CL 107 10/05/2020 1350   CO2 25 10/05/2020 1350   GLUCOSE 98 10/05/2020 1350   BUN 11 10/05/2020 1350   CREATININE 0.77 10/05/2020 1350   CALCIUM 8.7 (L) 10/05/2020 1350   PROT 7.0 10/05/2020 1350   ALBUMIN 3.9 10/05/2020 1350   AST 23 10/05/2020 1350   ALT 18 10/05/2020 1350   ALKPHOS 33 (L) 10/05/2020 1350   BILITOT 0.5 10/05/2020 1350   GFRNONAA >60 10/05/2020 1350   GFRAA >60 12/17/2017 0716     VAS [place] ABI WITH/WO TBI  Result Date: 08/25/2021  LOWER EXTREMITY DOPPLER STUDY Patient Name:  Natasha Carter  Date of Exam:   08/25/2021 Medical Rec #: [id number]   Accession #:    [id number] Date of Birth: Dec 13, 1990   Patient Gender: F Patient Age:   70 years Exam Location:  [facility] Procedure:      VAS [place] ABI WITH/WO  TBI Referring Phys: --------------------------------------------------------------------------------  Indications: Rest pain.  Performing Technologist: Concha Norway RVT  Examination Guidelines: A complete evaluation includes at minimum, Doppler waveform signals and systolic blood pressure reading at the level of bilateral brachial, anterior tibial, and posterior tibial arteries, when vessel segments are accessible. Bilateral testing is considered an integral part of a complete examination. Photoelectric Plethysmograph (PPG) waveforms and toe systolic pressure readings are included as required and additional duplex testing as needed. Limited examinations  for reoccurring indications may be performed as noted.  ABI Findings: +---------+------------------+-----+---------+--------+  Right     Rt Pressure (mmHg) Index Waveform  Comment   +---------+------------------+-----+---------+--------+  Brachial  134                                          +---------+------------------+-----+---------+--------+  ATA       153                      triphasic 1.14      +---------+------------------+-----+---------+--------+  PTA       146                1.09  triphasic           +---------+------------------+-----+---------+--------+  Great Toe 130                0.97  Normal              +---------+------------------+-----+---------+--------+ +---------+------------------+-----+---------+-------+  Left      Lt Pressure (mmHg) Index Waveform  Comment  +---------+------------------+-----+---------+-------+  Brachial  134                                         +---------+------------------+-----+---------+-------+  ATA       163                      triphasic 1.22     +---------+------------------+-----+---------+-------+  PTA       161                1.20  triphasic          +---------+------------------+-----+---------+-------+  Great Toe 123                0.92  Normal              +---------+------------------+-----+---------+-------+  Summary: Right: Resting right ankle-brachial index is within normal range. No evidence of significant right lower extremity arterial disease. The right toe-brachial index is normal. Left: Resting left ankle-brachial index is within normal range. No evidence of significant left lower extremity arterial disease. The left toe-brachial index is normal.  *See table(s) above for measurements and observations.  Electronically signed by Hortencia Pilar MD on 08/25/2021 at 5:03:51 PM.    Final        Assessment & Plan:   1. Varicose veins of both lower extremities with pain Recommend:  The patient is complaining of varicose veins.    I have had a long discussion with the patient regarding  varicose veins and why they cause symptoms.  Patient will begin wearing graduated compression stockings on a daily basis, beginning first thing in the morning and removing them in the evening. The patient is instructed specifically not to sleep in the stockings.    The patient  will also begin using over-the-counter analgesics such as Motrin 600 mg po TID to help control the symptoms as needed.    In addition, behavioral modification including elevation during the day will be initiated, utilizing a recliner was recommended.  The patient is also instructed to continue exercising such as walking 4-5 times per week.  At this time the patient wishes to continue conservative therapy and is not interested in more invasive treatments such  as laser ablation and sclerotherapy.  The Patient will follow up PRN if the symptoms worsen.   2. Numbness and tingling of both feet Recommend:  I do not find evidence of Vascular pathology that would explain the patient's symptoms  The patient has atypical numbness/tingling symptoms for vascular disease  I do not find evidence of Vascular pathology that would explain the patient's symptoms   Patient should have an evaluation of  his LS spine which I defer to the primary service.  Noninvasive studies including venous ultrasound of the legs do not identify vascular problems   Patient will follow-up with me on a PRN basis  Further work-up of her lower extremity pain is deferred to the primary service       Current Outpatient Medications on File Prior to Visit  Medication Sig Dispense Refill   acetaminophen (TYLENOL) 500 MG tablet Take 1,000 mg by mouth every 6 (six) hours as needed.     ibuprofen (ADVIL) 600 MG tablet Take 600 mg by mouth every 6 (six) hours as needed.     levocetirizine (XYZAL) 5 MG tablet Take 5 mg by mouth every evening.     montelukast (SINGULAIR) 10 MG tablet Take 10 mg by mouth daily.     Ferrous Sulfate 140 (45 Fe) MG TBCR Take 140 mg by mouth daily. (Patient not taking: Reported on 08/25/2021)     lansoprazole (PREVACID) 15 MG capsule Take 15 mg by mouth daily at 12 noon. (Patient not taking: Reported on 08/25/2021)     ondansetron (ZOFRAN) 8 MG tablet Take 8 mg by mouth every 4 (four) hours as needed for nausea or vomiting. (Patient not taking: Reported on 08/25/2021)     phentermine 37.5 MG capsule Take 37.5 mg by mouth daily before breakfast. (Patient not taking: Reported on 08/25/2021)     PROAIR HFA 108 (90 Base) MCG/ACT inhaler Inhale into the lungs. (Patient not taking: Reported on 08/25/2021)     No current facility-administered medications on file prior to visit.    There are no Patient Instructions on file for this visit. No follow-ups on file.   Kris Hartmann, NP

## 2022-06-08 ENCOUNTER — Encounter (INDEPENDENT_AMBULATORY_CARE_PROVIDER_SITE_OTHER): Payer: Self-pay

## 2022-06-17 IMAGING — CT CT ABD-PELV W/ CM
2 of 4 series · 17 of 46 positions shown, 19 images · IV contrast (omnipaque)
Comparison: October 05, 2020.

CLINICAL DATA: Left lower quadrant abdominal pain for several
months.

EXAM:
CT ABDOMEN AND PELVIS WITH CONTRAST
TECHNIQUE: Multidetector CT imaging of the abdomen and pelvis was performed
using the standard protocol following bolus administration of
intravenous contrast.
CONTRAST:  100mL OMNIPAQUE IOHEXOL 300 MG/ML  SOLN

[Series 2: abd pelvis 5.00 · axial · 0.98mm/px · z∈[-1509,-1034]mm · 14 of 105 slices shown, 16 images]
[im 5/105  soft-tissue]
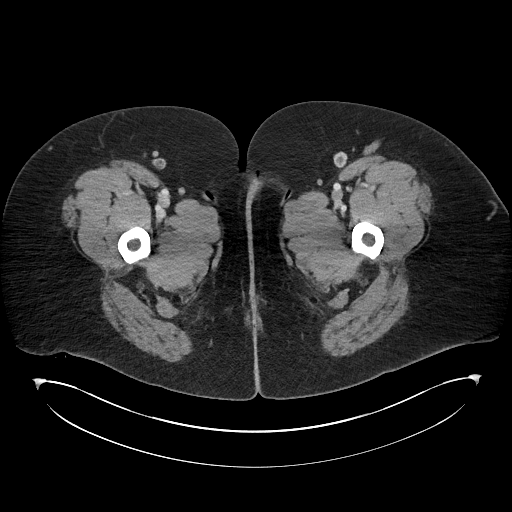
[im 5/105  bone]
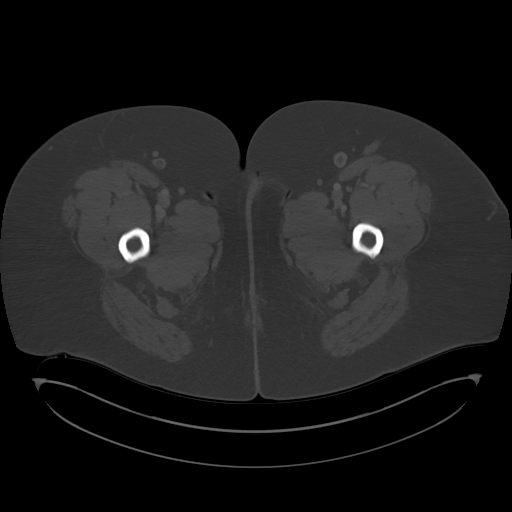
[im 15/105  soft-tissue]
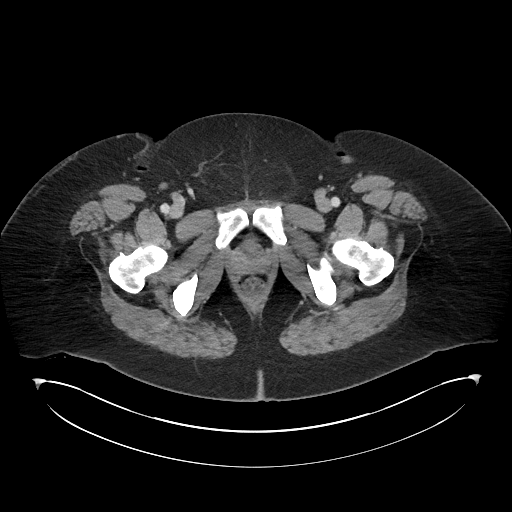
[im 19/105  soft-tissue]
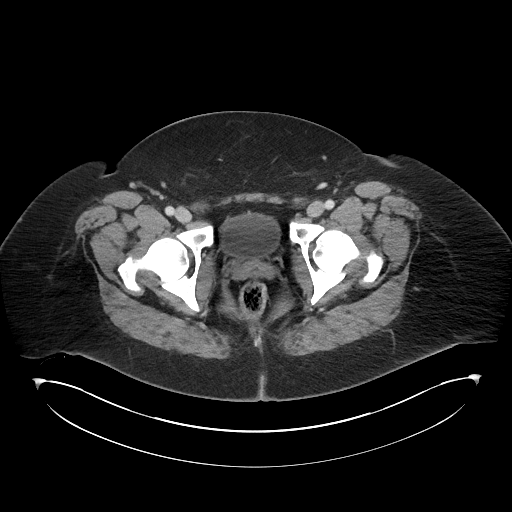
[im 29/105  soft-tissue]
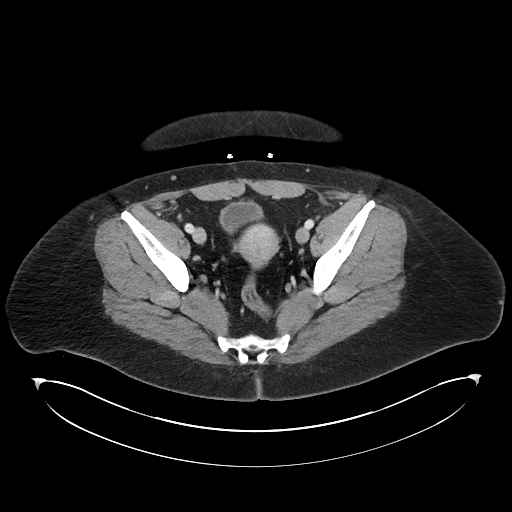
[im 34/105  soft-tissue]
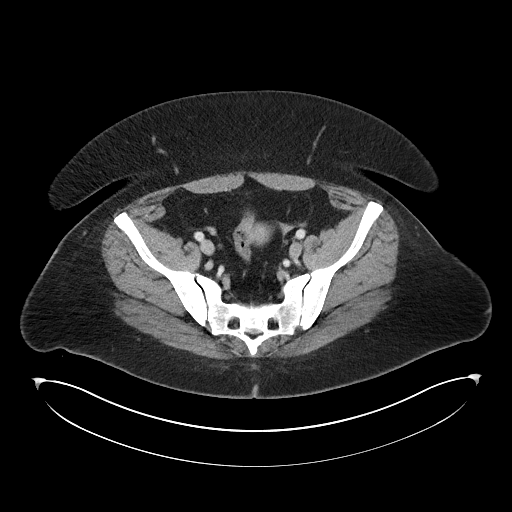
[im 43/105  soft-tissue]
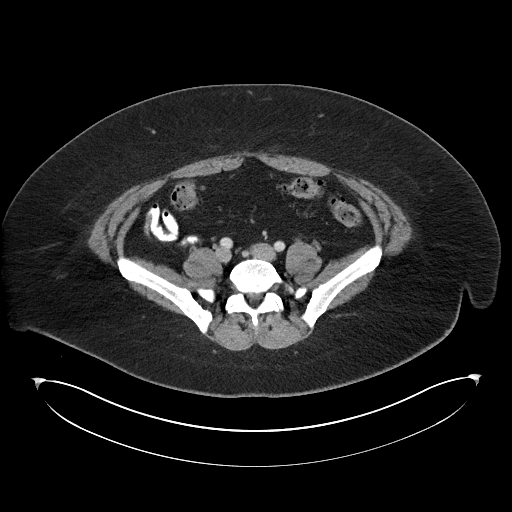
[im 48/105  soft-tissue]
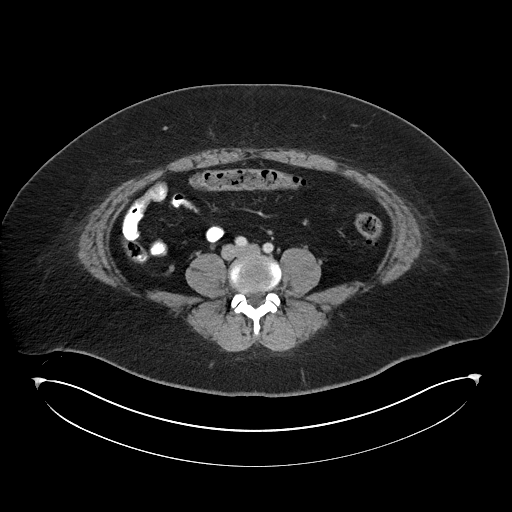
[im 57/105  soft-tissue]
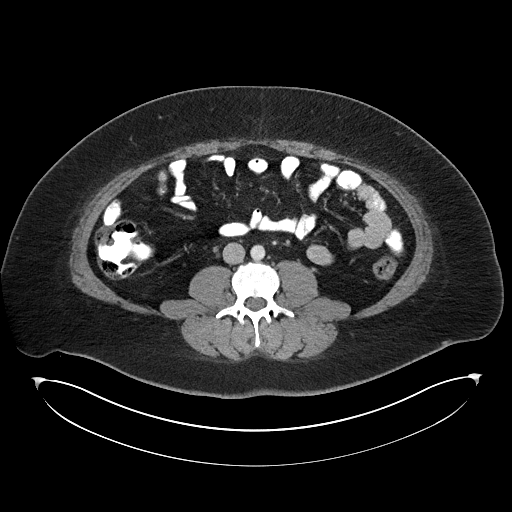
[im 62/105  soft-tissue]
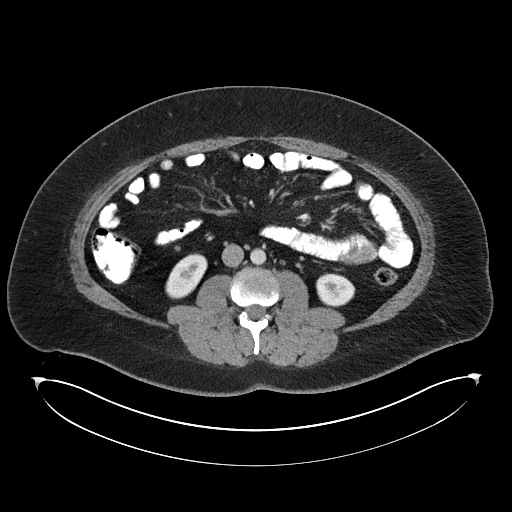
[im 62/105  bone]
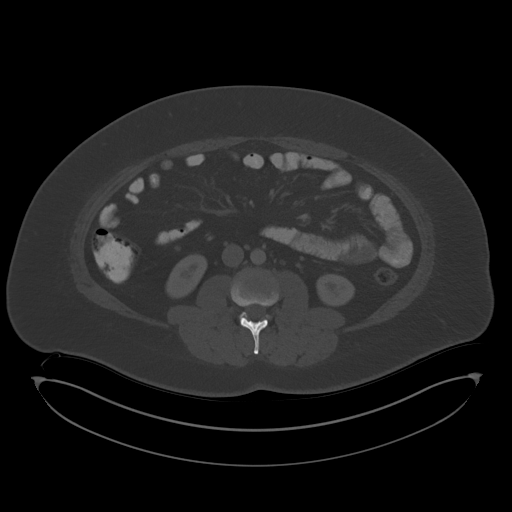
[im 71/105  soft-tissue]
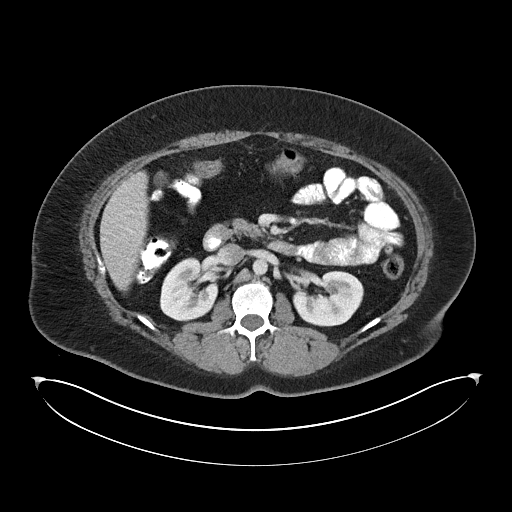
[im 76/105  soft-tissue]
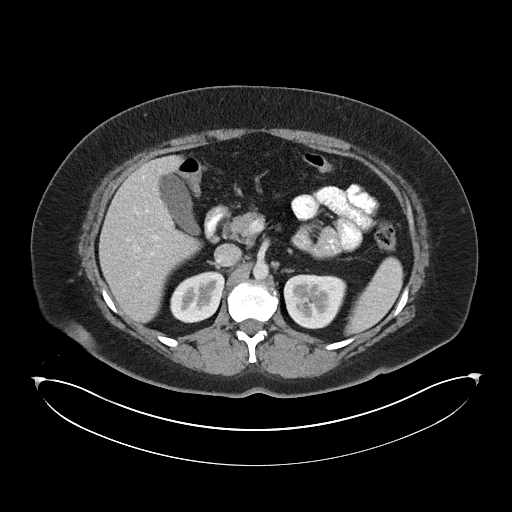
[im 86/105  soft-tissue]
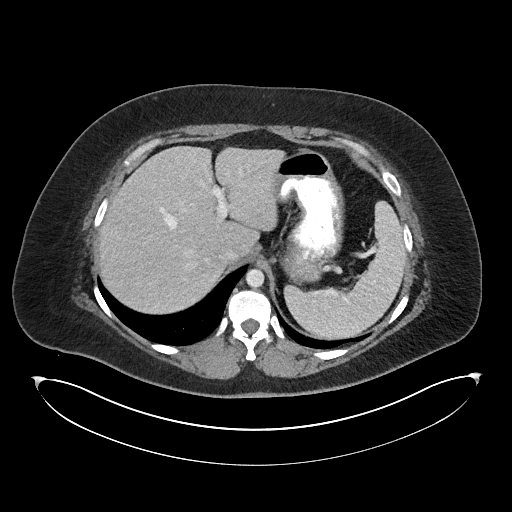
[im 90/105  soft-tissue]
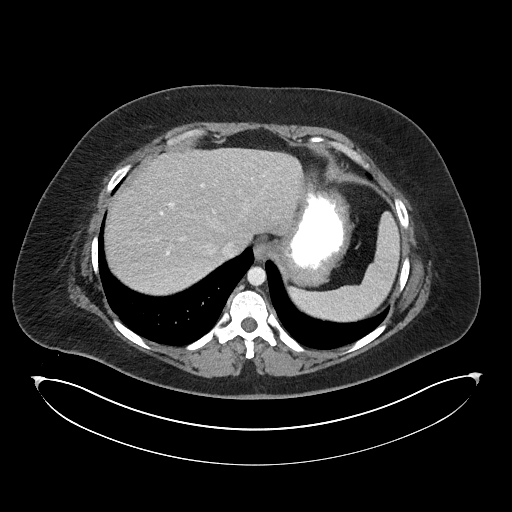
[im 100/105  soft-tissue]
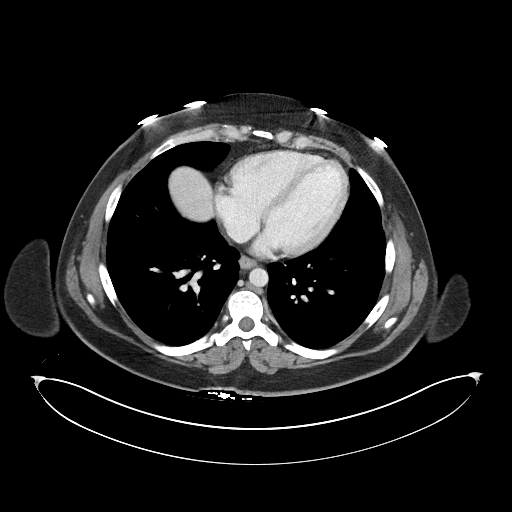

[Series 4: coronals abd pelvis 2.00 cor · coronal · 0.98mm/px · 3 of 158 slices shown]
[im 53/158  soft-tissue]
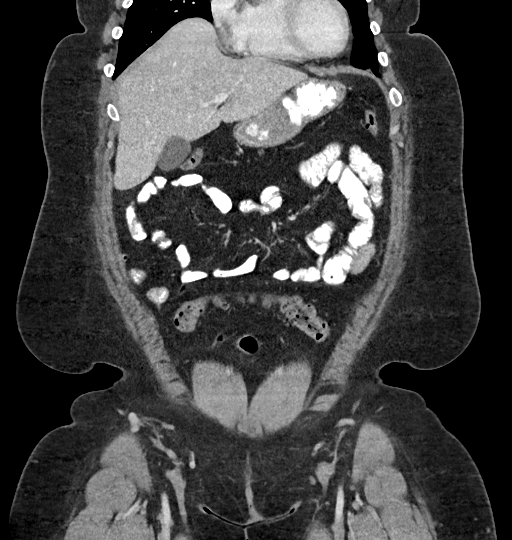
[im 70/158  soft-tissue]
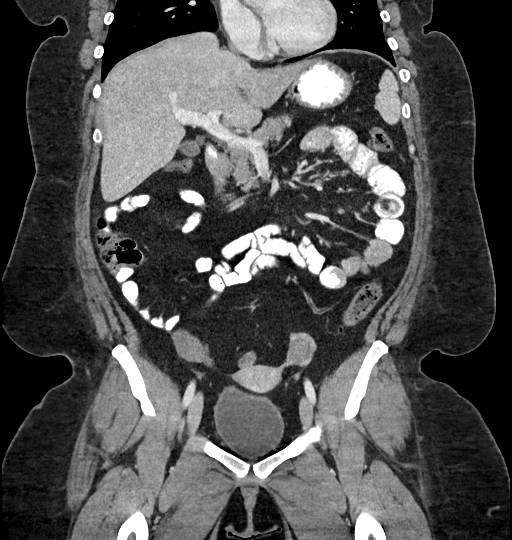
[im 88/158  soft-tissue]
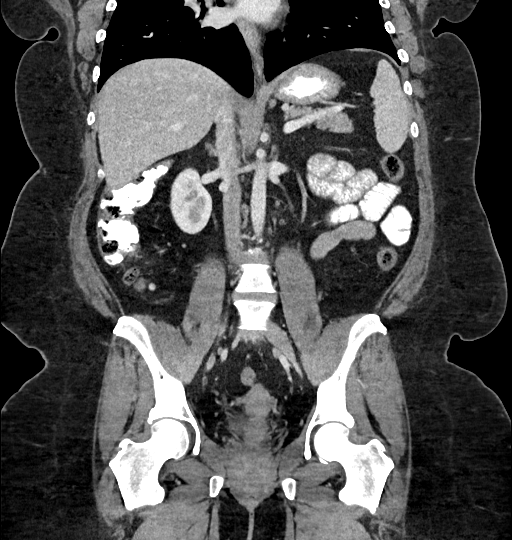

[17 of 46 positions shown; findings below may reference images not displayed]

FINDINGS: Lower chest: No acute abnormality.

Hepatobiliary: No focal liver abnormality is seen. No gallstones,
gallbladder wall thickening, or biliary dilatation.

Pancreas: Unremarkable. No pancreatic ductal dilatation or
surrounding inflammatory changes.

Spleen: Normal in size without focal abnormality.

Adrenals/Urinary Tract: Adrenal glands are unremarkable. Kidneys are
normal, without renal calculi, focal lesion, or hydronephrosis.
Bladder is unremarkable.

Stomach/Bowel: Stomach is within normal limits. Appendix appears
normal. No evidence of bowel wall thickening, distention, or
inflammatory changes. Diverticulosis of descending and sigmoid colon
is noted without inflammation.

Vascular/Lymphatic: No significant vascular findings are present. No
enlarged abdominal or pelvic lymph nodes.

Reproductive: Uterus and bilateral adnexa are unremarkable.

Other: No abdominal wall hernia or abnormality. No abdominopelvic
ascites.

Musculoskeletal: No acute or significant osseous findings.
IMPRESSION: Diverticulosis of descending and sigmoid colon without inflammation.

No acute abnormality seen in the abdomen or pelvis.

## 2022-07-07 DIAGNOSIS — Z3184 Encounter for fertility preservation procedure: Secondary | ICD-10-CM | POA: Diagnosis not present

## 2022-07-07 DIAGNOSIS — N926 Irregular menstruation, unspecified: Secondary | ICD-10-CM | POA: Diagnosis not present

## 2022-07-07 DIAGNOSIS — R102 Pelvic and perineal pain: Secondary | ICD-10-CM | POA: Diagnosis not present

## 2022-07-10 DIAGNOSIS — N926 Irregular menstruation, unspecified: Secondary | ICD-10-CM | POA: Diagnosis not present

## 2022-07-10 DIAGNOSIS — E669 Obesity, unspecified: Secondary | ICD-10-CM | POA: Diagnosis not present

## 2022-07-28 DIAGNOSIS — N970 Female infertility associated with anovulation: Secondary | ICD-10-CM | POA: Diagnosis not present

## 2022-09-10 DIAGNOSIS — Z0130 Encounter for examination of blood pressure without abnormal findings: Secondary | ICD-10-CM | POA: Diagnosis not present

## 2022-09-10 DIAGNOSIS — R059 Cough, unspecified: Secondary | ICD-10-CM | POA: Diagnosis not present

## 2022-09-10 DIAGNOSIS — Z1389 Encounter for screening for other disorder: Secondary | ICD-10-CM | POA: Diagnosis not present

## 2022-10-06 DIAGNOSIS — N926 Irregular menstruation, unspecified: Secondary | ICD-10-CM | POA: Diagnosis not present

## 2022-10-06 DIAGNOSIS — N9489 Other specified conditions associated with female genital organs and menstrual cycle: Secondary | ICD-10-CM | POA: Diagnosis not present

## 2022-10-06 DIAGNOSIS — R102 Pelvic and perineal pain: Secondary | ICD-10-CM | POA: Diagnosis not present

## 2023-01-05 DIAGNOSIS — N9489 Other specified conditions associated with female genital organs and menstrual cycle: Secondary | ICD-10-CM | POA: Diagnosis not present

## 2023-01-05 DIAGNOSIS — N469 Male infertility, unspecified: Secondary | ICD-10-CM | POA: Diagnosis not present

## 2023-01-22 DIAGNOSIS — Z3169 Encounter for other general counseling and advice on procreation: Secondary | ICD-10-CM | POA: Diagnosis not present

## 2023-01-27 DIAGNOSIS — Z3201 Encounter for pregnancy test, result positive: Secondary | ICD-10-CM | POA: Diagnosis not present

## 2023-01-29 DIAGNOSIS — Z3201 Encounter for pregnancy test, result positive: Secondary | ICD-10-CM | POA: Diagnosis not present

## 2023-02-10 DIAGNOSIS — Z3201 Encounter for pregnancy test, result positive: Secondary | ICD-10-CM | POA: Diagnosis not present

## 2023-03-02 DIAGNOSIS — Z113 Encounter for screening for infections with a predominantly sexual mode of transmission: Secondary | ICD-10-CM | POA: Diagnosis not present

## 2023-03-02 DIAGNOSIS — O26891 Other specified pregnancy related conditions, first trimester: Secondary | ICD-10-CM | POA: Diagnosis not present

## 2023-03-02 DIAGNOSIS — Z3A01 Less than 8 weeks gestation of pregnancy: Secondary | ICD-10-CM | POA: Diagnosis not present

## 2023-03-02 DIAGNOSIS — Z3689 Encounter for other specified antenatal screening: Secondary | ICD-10-CM | POA: Diagnosis not present

## 2023-03-02 DIAGNOSIS — Z3481 Encounter for supervision of other normal pregnancy, first trimester: Secondary | ICD-10-CM | POA: Diagnosis not present

## 2023-03-17 DIAGNOSIS — Z3481 Encounter for supervision of other normal pregnancy, first trimester: Secondary | ICD-10-CM | POA: Diagnosis not present

## 2023-03-17 DIAGNOSIS — Z3482 Encounter for supervision of other normal pregnancy, second trimester: Secondary | ICD-10-CM | POA: Diagnosis not present

## 2023-04-06 DIAGNOSIS — Z3A14 14 weeks gestation of pregnancy: Secondary | ICD-10-CM | POA: Diagnosis not present

## 2023-04-06 DIAGNOSIS — O99214 Obesity complicating childbirth: Secondary | ICD-10-CM | POA: Diagnosis not present

## 2023-04-06 DIAGNOSIS — O2341 Unspecified infection of urinary tract in pregnancy, first trimester: Secondary | ICD-10-CM | POA: Diagnosis not present
